# Patient Record
Sex: Female | Born: 1965 | Race: Black or African American | Hispanic: No | Marital: Married | Smoking: Never smoker
Health system: Southern US, Community
[De-identification: ages and names within clinical notes are randomized; demographics above are authoritative.]

## PROBLEM LIST (undated history)

## (undated) DIAGNOSIS — E7800 Pure hypercholesterolemia, unspecified: Secondary | ICD-10-CM

## (undated) DIAGNOSIS — Z992 Dependence on renal dialysis: Secondary | ICD-10-CM

## (undated) DIAGNOSIS — I10 Essential (primary) hypertension: Secondary | ICD-10-CM

## (undated) DIAGNOSIS — I871 Compression of vein: Secondary | ICD-10-CM

## (undated) HISTORY — PX: SKIN GRAFT: SHX250

## (undated) HISTORY — PX: TUBAL LIGATION: SHX77

## (undated) HISTORY — PX: CORNEAL TRANSPLANT: SHX108

## (undated) HISTORY — DX: Dependence on renal dialysis: Z99.2

## (undated) HISTORY — PX: THYROIDECTOMY: SHX17

## (undated) HISTORY — DX: Compression of vein: I87.1

---

## 2002-06-18 DIAGNOSIS — Z992 Dependence on renal dialysis: Secondary | ICD-10-CM

## 2002-06-18 HISTORY — DX: Dependence on renal dialysis: Z99.2

## 2003-06-08 ENCOUNTER — Other Ambulatory Visit: Payer: Self-pay

## 2005-07-27 ENCOUNTER — Emergency Department: Payer: Self-pay | Admitting: Emergency Medicine

## 2005-12-08 ENCOUNTER — Emergency Department: Payer: Self-pay | Admitting: Emergency Medicine

## 2005-12-13 ENCOUNTER — Emergency Department: Payer: Self-pay | Admitting: General Practice

## 2005-12-13 ENCOUNTER — Other Ambulatory Visit: Payer: Self-pay

## 2005-12-18 ENCOUNTER — Ambulatory Visit: Payer: Self-pay | Admitting: Nephrology

## 2005-12-20 ENCOUNTER — Ambulatory Visit: Payer: Self-pay | Admitting: Nephrology

## 2006-04-15 ENCOUNTER — Emergency Department: Payer: Self-pay | Admitting: Emergency Medicine

## 2006-06-21 ENCOUNTER — Emergency Department: Payer: Self-pay | Admitting: Emergency Medicine

## 2006-06-29 ENCOUNTER — Emergency Department: Payer: Self-pay | Admitting: Unknown Physician Specialty

## 2006-09-19 ENCOUNTER — Emergency Department: Payer: Self-pay | Admitting: Internal Medicine

## 2006-10-16 ENCOUNTER — Other Ambulatory Visit: Payer: Self-pay

## 2006-10-16 ENCOUNTER — Emergency Department: Payer: Self-pay | Admitting: Internal Medicine

## 2006-11-05 ENCOUNTER — Ambulatory Visit: Payer: Self-pay | Admitting: Vascular Surgery

## 2006-11-10 ENCOUNTER — Emergency Department: Payer: Self-pay | Admitting: Emergency Medicine

## 2006-12-17 ENCOUNTER — Emergency Department: Payer: Self-pay | Admitting: Emergency Medicine

## 2007-04-21 ENCOUNTER — Emergency Department: Payer: Self-pay

## 2007-04-21 ENCOUNTER — Other Ambulatory Visit: Payer: Self-pay

## 2007-05-28 ENCOUNTER — Ambulatory Visit: Payer: Self-pay

## 2007-09-02 ENCOUNTER — Ambulatory Visit: Payer: Self-pay | Admitting: Nephrology

## 2007-09-16 ENCOUNTER — Emergency Department: Payer: Self-pay | Admitting: Emergency Medicine

## 2007-10-16 ENCOUNTER — Ambulatory Visit: Payer: Self-pay | Admitting: Nephrology

## 2008-06-08 ENCOUNTER — Ambulatory Visit: Payer: Self-pay | Admitting: Internal Medicine

## 2008-11-09 ENCOUNTER — Ambulatory Visit: Payer: Self-pay | Admitting: Internal Medicine

## 2009-08-06 ENCOUNTER — Ambulatory Visit: Payer: Self-pay | Admitting: Nephrology

## 2010-06-15 ENCOUNTER — Ambulatory Visit: Payer: Self-pay

## 2010-08-28 ENCOUNTER — Inpatient Hospital Stay: Payer: Self-pay | Admitting: Internal Medicine

## 2011-03-30 ENCOUNTER — Emergency Department: Payer: Self-pay | Admitting: Unknown Physician Specialty

## 2011-06-18 ENCOUNTER — Ambulatory Visit: Payer: Self-pay | Admitting: Vascular Surgery

## 2011-06-25 ENCOUNTER — Emergency Department: Payer: Self-pay | Admitting: *Deleted

## 2011-06-25 LAB — CBC
HGB: 9.9 g/dL — ABNORMAL LOW (ref 12.0–16.0)
MCH: 32.1 pg (ref 26.0–34.0)
MCHC: 33 g/dL (ref 32.0–36.0)
Platelet: 262 10*3/uL (ref 150–440)
RDW: 16.8 % — ABNORMAL HIGH (ref 11.5–14.5)

## 2011-06-25 LAB — COMPREHENSIVE METABOLIC PANEL
Anion Gap: 12 (ref 7–16)
BUN: 65 mg/dL — ABNORMAL HIGH (ref 7–18)
Chloride: 98 mmol/L (ref 98–107)
Co2: 31 mmol/L (ref 21–32)
Creatinine: 12.38 mg/dL — ABNORMAL HIGH (ref 0.60–1.30)
EGFR (African American): 4 — ABNORMAL LOW
EGFR (Non-African Amer.): 3 — ABNORMAL LOW
Glucose: 82 mg/dL (ref 65–99)
SGPT (ALT): 24 U/L

## 2011-06-25 LAB — CK TOTAL AND CKMB (NOT AT ARMC)
CK, Total: 234 U/L — ABNORMAL HIGH (ref 21–215)
CK-MB: <0.5 ng/mL — ABNORMAL LOW (ref 0.5–3.6)

## 2011-06-25 LAB — PROTIME-INR: Prothrombin Time: 12.8 secs (ref 11.5–14.7)

## 2011-07-03 LAB — POTASSIUM
Potassium: 6.4 mmol/L — ABNORMAL HIGH (ref 3.5–5.1)
Potassium: 5.3 mmol/L — ABNORMAL HIGH (ref 3.5–5.1)

## 2011-07-04 ENCOUNTER — Inpatient Hospital Stay: Payer: Self-pay | Admitting: Internal Medicine

## 2011-07-04 LAB — CBC WITH DIFFERENTIAL/PLATELET
Basophil #: 0 10*3/uL (ref 0.0–0.1)
Basophil %: 0 %
Eosinophil #: 0 10*3/uL (ref 0.0–0.7)
Eosinophil #: 0 10*3/uL (ref 0.0–0.7)
Eosinophil %: 0 %
HGB: 9.9 g/dL — ABNORMAL LOW (ref 12.0–16.0)
Lymphocyte #: 0.4 10*3/uL — ABNORMAL LOW (ref 1.0–3.6)
Lymphocyte %: 8 %
MCH: 32.5 pg (ref 26.0–34.0)
MCH: 32.6 pg (ref 26.0–34.0)
MCHC: 33 g/dL (ref 32.0–36.0)
MCHC: 33.4 g/dL (ref 32.0–36.0)
MCV: 98 fL (ref 80–100)
Monocyte #: 0.1 10*3/uL (ref 0.0–0.7)
Monocyte #: 0.2 10*3/uL (ref 0.0–0.7)
Monocyte %: 1 %
Neutrophil #: 14.5 10*3/uL — ABNORMAL HIGH (ref 1.4–6.5)
Neutrophil %: 91 %
Neutrophil %: 95.7 %
Platelet: 211 10*3/uL (ref 150–440)
Platelet: 206 10*3/uL (ref 150–440)
RBC: 2.81 10*6/uL — ABNORMAL LOW (ref 3.80–5.20)
RDW: 17.5 % — ABNORMAL HIGH (ref 11.5–14.5)
WBC: 15.2 10*3/uL — ABNORMAL HIGH (ref 3.6–11.0)

## 2011-07-04 LAB — BASIC METABOLIC PANEL
Anion Gap: 19 — ABNORMAL HIGH (ref 7–16)
BUN: 109 mg/dL — ABNORMAL HIGH (ref 7–18)
Chloride: 97 mmol/L — ABNORMAL LOW (ref 98–107)
Co2: 24 mmol/L (ref 21–32)
Creatinine: 16.31 mg/dL — ABNORMAL HIGH (ref 0.60–1.30)
EGFR (Non-African Amer.): 3 — ABNORMAL LOW
Glucose: 119 mg/dL — ABNORMAL HIGH (ref 65–99)
Potassium: 5.5 mmol/L — ABNORMAL HIGH (ref 3.5–5.1)
Sodium: 140 mmol/L (ref 136–145)

## 2011-07-05 LAB — CBC WITH DIFFERENTIAL/PLATELET
Basophil #: 0 10*3/uL (ref 0.0–0.1)
Lymphocyte #: 0.8 10*3/uL — ABNORMAL LOW (ref 1.0–3.6)
Lymphocyte %: 8.9 %
MCH: 32.7 pg (ref 26.0–34.0)
MCV: 100 fL (ref 80–100)
Monocyte #: 0.4 10*3/uL (ref 0.0–0.7)
Monocyte %: 4.3 %
Platelet: 198 10*3/uL (ref 150–440)
RDW: 17.9 % — ABNORMAL HIGH (ref 11.5–14.5)
WBC: 9.5 10*3/uL (ref 3.6–11.0)

## 2011-07-05 LAB — RENAL FUNCTION PANEL
Albumin: 3.5 g/dL (ref 3.4–5.0)
Anion Gap: 19 — ABNORMAL HIGH (ref 7–16)
Calcium, Total: 6.7 mg/dL — CL (ref 8.5–10.1)
Chloride: 96 mmol/L — ABNORMAL LOW (ref 98–107)
Co2: 27 mmol/L (ref 21–32)
Creatinine: 9.61 mg/dL — ABNORMAL HIGH (ref 0.60–1.30)
EGFR (Non-African Amer.): 5 — ABNORMAL LOW
Potassium: 4.3 mmol/L (ref 3.5–5.1)

## 2012-07-14 ENCOUNTER — Ambulatory Visit: Payer: Self-pay | Admitting: Vascular Surgery

## 2012-07-14 LAB — POTASSIUM: Potassium: 5.2 mmol/L — ABNORMAL HIGH (ref 3.5–5.1)

## 2012-10-22 ENCOUNTER — Ambulatory Visit: Payer: Self-pay | Admitting: Vascular Surgery

## 2012-11-11 ENCOUNTER — Ambulatory Visit: Payer: Self-pay | Admitting: Vascular Surgery

## 2012-12-05 ENCOUNTER — Inpatient Hospital Stay: Payer: Self-pay | Admitting: Internal Medicine

## 2012-12-05 LAB — BASIC METABOLIC PANEL
Anion Gap: 9 (ref 7–16)
BUN: 39 mg/dL — ABNORMAL HIGH (ref 7–18)
Calcium, Total: 9.1 mg/dL (ref 8.5–10.1)
EGFR (African American): 5 — ABNORMAL LOW
EGFR (Non-African Amer.): 5 — ABNORMAL LOW
Osmolality: 287 (ref 275–301)

## 2012-12-05 LAB — TROPONIN I: Troponin-I: <0.02 ng/mL

## 2012-12-05 LAB — CBC
MCH: 31.9 pg (ref 26.0–34.0)
MCHC: 32.9 g/dL (ref 32.0–36.0)
MCV: 97 fL (ref 80–100)
Platelet: 261 10*3/uL (ref 150–440)
RBC: 3.07 10*6/uL — ABNORMAL LOW (ref 3.80–5.20)
RDW: 15.9 % — ABNORMAL HIGH (ref 11.5–14.5)
WBC: 8 10*3/uL (ref 3.6–11.0)

## 2012-12-05 LAB — PROTIME-INR: INR: 1

## 2012-12-05 LAB — APTT: Activated PTT: <23 secs — ABNORMAL LOW (ref 23.6–35.9)

## 2012-12-05 LAB — PRO B NATRIURETIC PEPTIDE: B-Type Natriuretic Peptide: 2770 pg/mL — ABNORMAL HIGH (ref 0–125)

## 2012-12-06 LAB — CBC WITH DIFFERENTIAL/PLATELET
Basophil #: 0.1 10*3/uL (ref 0.0–0.1)
Basophil %: 0.6 %
Eosinophil #: 0 10*3/uL (ref 0.0–0.7)
Eosinophil %: 0.2 %
HGB: 9.1 g/dL — ABNORMAL LOW (ref 12.0–16.0)
Lymphocyte %: 7.5 %
MCHC: 33.2 g/dL (ref 32.0–36.0)
Monocyte #: 0.1 x10 3/mm — ABNORMAL LOW (ref 0.2–0.9)
Monocyte %: 1.3 %
RBC: 2.84 10*6/uL — ABNORMAL LOW (ref 3.80–5.20)
WBC: 10.7 10*3/uL (ref 3.6–11.0)

## 2012-12-06 LAB — BASIC METABOLIC PANEL
BUN: 49 mg/dL — ABNORMAL HIGH (ref 7–18)
Chloride: 99 mmol/L (ref 98–107)
Co2: 31 mmol/L (ref 21–32)
Creatinine: 10.77 mg/dL — ABNORMAL HIGH (ref 0.60–1.30)
EGFR (Non-African Amer.): 4 — ABNORMAL LOW
Glucose: 166 mg/dL — ABNORMAL HIGH (ref 65–99)
Osmolality: 294 (ref 275–301)
Potassium: 5.3 mmol/L — ABNORMAL HIGH (ref 3.5–5.1)
Sodium: 139 mmol/L (ref 136–145)

## 2012-12-06 LAB — APTT: Activated PTT: 83.9 secs — ABNORMAL HIGH (ref 23.6–35.9)

## 2012-12-06 LAB — LIPID PANEL
Cholesterol: 293 mg/dL — ABNORMAL HIGH (ref 0–200)
HDL Cholesterol: 106 mg/dL — ABNORMAL HIGH (ref 40–60)
Ldl Cholesterol, Calc: 176 mg/dL — ABNORMAL HIGH (ref 0–100)
Triglycerides: 55 mg/dL (ref 0–200)

## 2012-12-07 LAB — BASIC METABOLIC PANEL
Anion Gap: 10 (ref 7–16)
Chloride: 95 mmol/L — ABNORMAL LOW (ref 98–107)
Co2: 31 mmol/L (ref 21–32)
Creatinine: 8.08 mg/dL — ABNORMAL HIGH (ref 0.60–1.30)
Glucose: 132 mg/dL — ABNORMAL HIGH (ref 65–99)
Osmolality: 285 (ref 275–301)
Potassium: 5.3 mmol/L — ABNORMAL HIGH (ref 3.5–5.1)

## 2012-12-07 LAB — CBC WITH DIFFERENTIAL/PLATELET
Basophil #: 0.1 10*3/uL (ref 0.0–0.1)
Eosinophil #: 0 10*3/uL (ref 0.0–0.7)
Eosinophil %: 0 %
HCT: 27.8 % — ABNORMAL LOW (ref 35.0–47.0)
HGB: 9 g/dL — ABNORMAL LOW (ref 12.0–16.0)
MCH: 30.8 pg (ref 26.0–34.0)
Monocyte #: 0.7 x10 3/mm (ref 0.2–0.9)
Monocyte %: 3.6 %
Neutrophil #: 17.9 10*3/uL — ABNORMAL HIGH (ref 1.4–6.5)
Neutrophil %: 90.6 %
RBC: 2.91 10*6/uL — ABNORMAL LOW (ref 3.80–5.20)
RDW: 15.7 % — ABNORMAL HIGH (ref 11.5–14.5)
WBC: 19.7 10*3/uL — ABNORMAL HIGH (ref 3.6–11.0)

## 2012-12-07 LAB — APTT: Activated PTT: 93 secs — ABNORMAL HIGH (ref 23.6–35.9)

## 2012-12-08 LAB — RENAL FUNCTION PANEL
Albumin: 3.7 g/dL (ref 3.4–5.0)
Anion Gap: 15 (ref 7–16)
Chloride: 92 mmol/L — ABNORMAL LOW (ref 98–107)
Co2: 27 mmol/L (ref 21–32)
Creatinine: 11.06 mg/dL — ABNORMAL HIGH (ref 0.60–1.30)
EGFR (African American): 4 — ABNORMAL LOW
EGFR (Non-African Amer.): 4 — ABNORMAL LOW
Phosphorus: 4.6 mg/dL (ref 2.5–4.9)
Sodium: 134 mmol/L — ABNORMAL LOW (ref 136–145)

## 2012-12-08 LAB — PROTIME-INR: INR: 1.1

## 2012-12-08 LAB — CBC WITH DIFFERENTIAL/PLATELET
Basophil #: 0 10*3/uL (ref 0.0–0.1)
Eosinophil %: 0 %
Lymphocyte %: 6.2 %
MCH: 31.8 pg (ref 26.0–34.0)
MCV: 96 fL (ref 80–100)
Neutrophil #: 20.9 10*3/uL — ABNORMAL HIGH (ref 1.4–6.5)
Platelet: 361 10*3/uL (ref 150–440)
RDW: 15.9 % — ABNORMAL HIGH (ref 11.5–14.5)

## 2012-12-09 LAB — APTT
Activated PTT: 153 secs — ABNORMAL HIGH (ref 23.6–35.9)
Activated PTT: >160 secs (ref 23.6–35.9)
Activated PTT: 87.2 secs — ABNORMAL HIGH (ref 23.6–35.9)

## 2012-12-09 LAB — CBC WITH DIFFERENTIAL/PLATELET
Basophil #: 0 10*3/uL (ref 0.0–0.1)
Eosinophil #: 0 10*3/uL (ref 0.0–0.7)
HCT: 27.5 % — ABNORMAL LOW (ref 35.0–47.0)
HGB: 9.2 g/dL — ABNORMAL LOW (ref 12.0–16.0)
Lymphocyte %: 8.2 %
MCH: 31.7 pg (ref 26.0–34.0)
MCHC: 33.4 g/dL (ref 32.0–36.0)
Monocyte #: 1.1 x10 3/mm — ABNORMAL HIGH (ref 0.2–0.9)
Monocyte %: 5.4 %
Platelet: 373 10*3/uL (ref 150–440)
RDW: 16 % — ABNORMAL HIGH (ref 11.5–14.5)
WBC: 20.7 10*3/uL — ABNORMAL HIGH (ref 3.6–11.0)

## 2012-12-09 LAB — PROTIME-INR
INR: 1.3
Prothrombin Time: 16.6 secs — ABNORMAL HIGH (ref 11.5–14.7)

## 2012-12-09 LAB — PHOSPHORUS: Phosphorus: 5.8 mg/dL — ABNORMAL HIGH (ref 2.5–4.9)

## 2012-12-10 LAB — CBC WITH DIFFERENTIAL/PLATELET
Basophil #: 0.1 10*3/uL (ref 0.0–0.1)
Basophil %: 0.4 %
Eosinophil %: 0 %
HCT: 27.8 % — ABNORMAL LOW (ref 35.0–47.0)
HGB: 9.3 g/dL — ABNORMAL LOW (ref 12.0–16.0)
MCH: 32 pg (ref 26.0–34.0)
MCHC: 33.4 g/dL (ref 32.0–36.0)
MCV: 96 fL (ref 80–100)
Monocyte #: 1.5 x10 3/mm — ABNORMAL HIGH (ref 0.2–0.9)
Monocyte %: 7.2 %
Neutrophil #: 17.6 10*3/uL — ABNORMAL HIGH (ref 1.4–6.5)
Platelet: 320 10*3/uL (ref 150–440)
RBC: 2.9 10*6/uL — ABNORMAL LOW (ref 3.80–5.20)
WBC: 20.7 10*3/uL — ABNORMAL HIGH (ref 3.6–11.0)

## 2012-12-10 LAB — PROTIME-INR: Prothrombin Time: 21.7 secs — ABNORMAL HIGH (ref 11.5–14.7)

## 2012-12-10 LAB — APTT: Activated PTT: 103.4 secs — ABNORMAL HIGH (ref 23.6–35.9)

## 2012-12-11 LAB — RENAL FUNCTION PANEL
Albumin: 3.4 g/dL (ref 3.4–5.0)
BUN: 100 mg/dL — ABNORMAL HIGH (ref 7–18)
Calcium, Total: 7.3 mg/dL — ABNORMAL LOW (ref 8.5–10.1)
Co2: 26 mmol/L (ref 21–32)
Creatinine: 11.42 mg/dL — ABNORMAL HIGH (ref 0.60–1.30)
EGFR (African American): 4 — ABNORMAL LOW
EGFR (Non-African Amer.): 4 — ABNORMAL LOW
Osmolality: 290 (ref 275–301)
Phosphorus: 6.4 mg/dL — ABNORMAL HIGH (ref 2.5–4.9)
Potassium: 5.7 mmol/L — ABNORMAL HIGH (ref 3.5–5.1)
Sodium: 128 mmol/L — ABNORMAL LOW (ref 136–145)

## 2012-12-11 LAB — CBC WITH DIFFERENTIAL/PLATELET
Basophil %: 0.2 %
Eosinophil #: 0 10*3/uL (ref 0.0–0.7)
HGB: 9.7 g/dL — ABNORMAL LOW (ref 12.0–16.0)
Lymphocyte #: 1.8 10*3/uL (ref 1.0–3.6)
Lymphocyte %: 9 %
MCH: 32.1 pg (ref 26.0–34.0)
MCHC: 33.7 g/dL (ref 32.0–36.0)
MCV: 95 fL (ref 80–100)
Monocyte #: 1.2 x10 3/mm — ABNORMAL HIGH (ref 0.2–0.9)
RBC: 3.03 10*6/uL — ABNORMAL LOW (ref 3.80–5.20)
RDW: 15.9 % — ABNORMAL HIGH (ref 11.5–14.5)

## 2012-12-11 LAB — POTASSIUM: Potassium: 4.7 mmol/L (ref 3.5–5.1)

## 2012-12-11 LAB — APTT
Activated PTT: >160 secs (ref 23.6–35.9)
Activated PTT: 28.7 secs (ref 23.6–35.9)

## 2012-12-11 LAB — PROTIME-INR
INR: 2.7
Prothrombin Time: 27.9 secs — ABNORMAL HIGH (ref 11.5–14.7)

## 2012-12-12 LAB — CBC WITH DIFFERENTIAL/PLATELET
Basophil #: 0 10*3/uL (ref 0.0–0.1)
Eosinophil #: 0 10*3/uL (ref 0.0–0.7)
Eosinophil %: 0.1 %
Lymphocyte #: 0.3 10*3/uL — ABNORMAL LOW (ref 1.0–3.6)
MCH: 32 pg (ref 26.0–34.0)
MCHC: 33.3 g/dL (ref 32.0–36.0)
Monocyte #: 0.1 x10 3/mm — ABNORMAL LOW (ref 0.2–0.9)
Platelet: 219 10*3/uL (ref 150–440)
RBC: 2.87 10*6/uL — ABNORMAL LOW (ref 3.80–5.20)
RDW: 16.1 % — ABNORMAL HIGH (ref 11.5–14.5)
WBC: 14.9 10*3/uL — ABNORMAL HIGH (ref 3.6–11.0)

## 2012-12-12 LAB — BASIC METABOLIC PANEL
Calcium, Total: 7.4 mg/dL — ABNORMAL LOW (ref 8.5–10.1)
Chloride: 94 mmol/L — ABNORMAL LOW (ref 98–107)
Co2: 31 mmol/L (ref 21–32)
Creatinine: 8.54 mg/dL — ABNORMAL HIGH (ref 0.60–1.30)
EGFR (African American): 6 — ABNORMAL LOW
EGFR (Non-African Amer.): 5 — ABNORMAL LOW
Osmolality: 289 (ref 275–301)
Potassium: 3.9 mmol/L (ref 3.5–5.1)
Sodium: 135 mmol/L — ABNORMAL LOW (ref 136–145)

## 2012-12-13 LAB — BASIC METABOLIC PANEL
BUN: 90 mg/dL — ABNORMAL HIGH (ref 7–18)
Calcium, Total: 6.8 mg/dL — CL (ref 8.5–10.1)
Co2: 28 mmol/L (ref 21–32)
Creatinine: 11.92 mg/dL — ABNORMAL HIGH (ref 0.60–1.30)
EGFR (African American): 4 — ABNORMAL LOW
Glucose: 131 mg/dL — ABNORMAL HIGH (ref 65–99)
Osmolality: 296 (ref 275–301)
Potassium: 4.1 mmol/L (ref 3.5–5.1)
Sodium: 133 mmol/L — ABNORMAL LOW (ref 136–145)

## 2012-12-13 LAB — CBC WITH DIFFERENTIAL/PLATELET
Basophil #: 0 10*3/uL (ref 0.0–0.1)
Basophil %: 0.1 %
HCT: 28 % — ABNORMAL LOW (ref 35.0–47.0)
Lymphocyte %: 6.2 %
MCHC: 33.2 g/dL (ref 32.0–36.0)
MCV: 96 fL (ref 80–100)
Monocyte #: 1.2 x10 3/mm — ABNORMAL HIGH (ref 0.2–0.9)
Neutrophil #: 16.1 10*3/uL — ABNORMAL HIGH (ref 1.4–6.5)
Neutrophil %: 86.7 %
RBC: 2.91 10*6/uL — ABNORMAL LOW (ref 3.80–5.20)
RDW: 16.8 % — ABNORMAL HIGH (ref 11.5–14.5)
WBC: 18.5 10*3/uL — ABNORMAL HIGH (ref 3.6–11.0)

## 2012-12-13 LAB — ALBUMIN: Albumin: 3.2 g/dL — ABNORMAL LOW (ref 3.4–5.0)

## 2012-12-13 LAB — PHOSPHORUS: Phosphorus: 4.8 mg/dL (ref 2.5–4.9)

## 2012-12-18 LAB — CULTURE, BLOOD (SINGLE)

## 2012-12-31 ENCOUNTER — Ambulatory Visit: Payer: Self-pay | Admitting: Internal Medicine

## 2013-01-08 ENCOUNTER — Other Ambulatory Visit: Payer: Self-pay | Admitting: Nephrology

## 2013-01-08 LAB — PROTIME-INR
INR: 2.2
Prothrombin Time: 23.6 secs — ABNORMAL HIGH (ref 11.5–14.7)

## 2013-01-13 ENCOUNTER — Encounter: Payer: Self-pay | Admitting: General Surgery

## 2013-01-13 ENCOUNTER — Ambulatory Visit (INDEPENDENT_AMBULATORY_CARE_PROVIDER_SITE_OTHER): Payer: Medicare Other | Admitting: General Surgery

## 2013-01-13 ENCOUNTER — Other Ambulatory Visit: Payer: Self-pay

## 2013-01-13 VITALS — BP 160/84 | HR 84 | Resp 13 | Ht 60.0 in | Wt 187.0 lb

## 2013-01-13 DIAGNOSIS — N63 Unspecified lump in unspecified breast: Secondary | ICD-10-CM

## 2013-01-13 DIAGNOSIS — N6310 Unspecified lump in the right breast, unspecified quadrant: Secondary | ICD-10-CM

## 2013-01-13 DIAGNOSIS — I871 Compression of vein: Secondary | ICD-10-CM | POA: Insufficient documentation

## 2013-01-13 DIAGNOSIS — Z803 Family history of malignant neoplasm of breast: Secondary | ICD-10-CM | POA: Insufficient documentation

## 2013-01-13 NOTE — Patient Instructions (Addendum)
Patient to return in 3 months. To call if new problems with her breasts

## 2013-01-13 NOTE — Progress Notes (Signed)
Patient ID: Angela Best, female   DOB: 1966/06/11, 47 y.o.   MRN: [id number]  Chief Complaint  Patient presents with  . Other    mammogram    HPI Angela Best is a 47 y.o. female who presents for a breast evaluation. The most recent mammogram was done on 12/31/12 cat4 Patient does perform regular self breast checks and does not regular mammograms done. Patient has never had breast problems in the past.  Pt has ESRD and is on hemodialysis. 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 She is on coumadin now. Since that time she has noted both breasts being swollen and more uncomfortable.  HPI  Past Medical History  Diagnosis Date  . Dialysis patient 2004  . SVC (superior vena cava obstruction)     Past Surgical History  Procedure Laterality Date  . Cesarean section    . Tubal ligation    . Thyroidectomy    . Skin graft Left   . Corneal transplant      Family History  Problem Relation Age of Onset  . Breast cancer Mother     Social History History  Substance Use Topics  . Smoking status: Never Smoker   . Smokeless tobacco: Never Used  . Alcohol Use: No    Allergies  Allergen Reactions  . Benadryl (Diphenhydramine Hcl) Itching  . Captopril Itching  . Contrast Media (Iodinated Diagnostic Agents) Itching  . Demerol (Meperidine) Itching  . Morphine And Related Itching  . Tape Rash    Current Outpatient Prescriptions  Medication Sig Dispense Refill  . aspirin 81 MG tablet Take 81 mg by mouth daily.      . calcium & magnesium carbonates (MYLANTA) 311-232 MG per tablet Take 1 tablet by mouth daily.      Marland Kitchen warfarin (COUMADIN) 1 MG tablet Take 1 tablet by mouth 4 (four) times daily.       No current facility-administered medications for this visit.    Review of Systems Review of Systems  Constitutional: Negative.   Respiratory: Negative.   Cardiovascular: Negative.     Blood pressure 160/84,  pulse 84, resp. rate 13, height 5' (1.524 m), weight 187 lb (84.823 kg).  Physical Exam Physical Exam  Constitutional: She is oriented to person, place, and time. She appears well-developed and well-nourished.  HENT:  Head:    Eyes: Conjunctivae are normal. No scleral icterus.  Neck: No mass and no thyromegaly present.  Cardiovascular: Normal rate, regular rhythm and normal heart sounds.   Pulmonary/Chest: Effort normal and breath sounds normal. Right breast exhibits skin change and tenderness. Right breast exhibits no inverted nipple, no mass and no nipple discharge. Left breast exhibits skin change and tenderness. Left breast exhibits no inverted nipple, no mass and no nipple discharge.    Abdominal: Soft. Bowel sounds are normal. There is no tenderness.  Lymphadenopathy:    She has no cervical adenopathy.    She has no axillary adenopathy.  Neurological: She is alert and oriented to person, place, and time.  Skin: Skin is warm and dry.       Data Reviewed Mammogram reviewed bilateral swelling and venous distension. No focal abnormality. Likely all due to SVC syndrome. Targeted US over palpable thickening in right breast 1-2 o'cl shows no abnormality Assessment    Right breast mass is likely benign-can be followed. Mammographic findings consistent with SVC syndrome     Plan  Advised use of a snug bra. F/U in  3 mos.        Stephens Shreve G 01/13/2013, 6:41 PM

## 2013-04-13 ENCOUNTER — Ambulatory Visit: Payer: Medicare Other | Admitting: General Surgery

## 2013-04-23 ENCOUNTER — Ambulatory Visit: Payer: Self-pay | Admitting: General Surgery

## 2013-06-04 ENCOUNTER — Encounter: Payer: Self-pay | Admitting: *Deleted

## 2013-10-13 ENCOUNTER — Inpatient Hospital Stay: Payer: Self-pay | Admitting: Internal Medicine

## 2013-10-13 LAB — CBC
HCT: 35.8 % (ref 35.0–47.0)
HGB: 11.5 g/dL — AB (ref 12.0–16.0)
MCH: 32.1 pg (ref 26.0–34.0)
MCHC: 32.2 g/dL (ref 32.0–36.0)
MCV: 100 fL (ref 80–100)
PLATELETS: 247 10*3/uL (ref 150–440)
RBC: 3.6 10*6/uL — AB (ref 3.80–5.20)
RDW: 15.4 % — ABNORMAL HIGH (ref 11.5–14.5)
WBC: 4.7 10*3/uL (ref 3.6–11.0)

## 2013-10-13 LAB — COMPREHENSIVE METABOLIC PANEL
ANION GAP: 9 (ref 7–16)
Albumin: 4.5 g/dL (ref 3.4–5.0)
Alkaline Phosphatase: 126 U/L — ABNORMAL HIGH
BUN: 33 mg/dL — ABNORMAL HIGH (ref 7–18)
Bilirubin,Total: 0.3 mg/dL (ref 0.2–1.0)
CHLORIDE: 96 mmol/L — AB (ref 98–107)
Calcium, Total: 9 mg/dL (ref 8.5–10.1)
Co2: 34 mmol/L — ABNORMAL HIGH (ref 21–32)
Creatinine: 8.21 mg/dL — ABNORMAL HIGH (ref 0.60–1.30)
EGFR (African American): 6 — ABNORMAL LOW
EGFR (Non-African Amer.): 5 — ABNORMAL LOW
Glucose: 79 mg/dL (ref 65–99)
OSMOLALITY: 284 (ref 275–301)
Potassium: 3.6 mmol/L (ref 3.5–5.1)
SGOT(AST): 27 U/L (ref 15–37)
SGPT (ALT): 28 U/L (ref 12–78)
Sodium: 139 mmol/L (ref 136–145)
Total Protein: 9 g/dL — ABNORMAL HIGH (ref 6.4–8.2)

## 2013-10-13 LAB — PROTIME-INR
INR: 3.7
Prothrombin Time: 35.5 secs — ABNORMAL HIGH (ref 11.5–14.7)

## 2013-10-13 LAB — APTT: Activated PTT: 65.9 secs — ABNORMAL HIGH (ref 23.6–35.9)

## 2013-10-14 LAB — CBC WITH DIFFERENTIAL/PLATELET
BASOS PCT: 1.1 %
Basophil #: 0.1 10*3/uL (ref 0.0–0.1)
Eosinophil #: 0.1 10*3/uL (ref 0.0–0.7)
Eosinophil %: 1.7 %
HCT: 32.2 % — ABNORMAL LOW (ref 35.0–47.0)
HGB: 10.4 g/dL — ABNORMAL LOW (ref 12.0–16.0)
LYMPHS ABS: 1.5 10*3/uL (ref 1.0–3.6)
LYMPHS PCT: 30.3 %
MCH: 32.3 pg (ref 26.0–34.0)
MCHC: 32.4 g/dL (ref 32.0–36.0)
MCV: 100 fL (ref 80–100)
Monocyte #: 0.6 x10 3/mm (ref 0.2–0.9)
Monocyte %: 12.1 %
Neutrophil #: 2.7 10*3/uL (ref 1.4–6.5)
Neutrophil %: 54.8 %
PLATELETS: 221 10*3/uL (ref 150–440)
RBC: 3.22 10*6/uL — AB (ref 3.80–5.20)
RDW: 15.3 % — AB (ref 11.5–14.5)
WBC: 5 10*3/uL (ref 3.6–11.0)

## 2013-10-14 LAB — PROTIME-INR
INR: 1.5
Prothrombin Time: 17.4 secs — ABNORMAL HIGH (ref 11.5–14.7)

## 2014-04-19 ENCOUNTER — Encounter: Payer: Self-pay | Admitting: General Surgery

## 2014-06-03 IMAGING — CR DG CHEST 2V
1 series · 2 of 2 positions shown · non-contrast
Comparison: none

REASON FOR EXAM: SOB
COMMENTS:

PROCEDURE:     DXR - DXR CHEST PA (OR AP) AND LATERAL  - December 05, 2012  [DATE]
RESULT:     The lungs are clear. The cardiac silhouette and visualized bony
skeleton are unremarkable. Patient's has taken a shallow inspiration.

[Series 1: pa · 0.17mm/px · 2 of 2 slices shown]
[im 1/2]
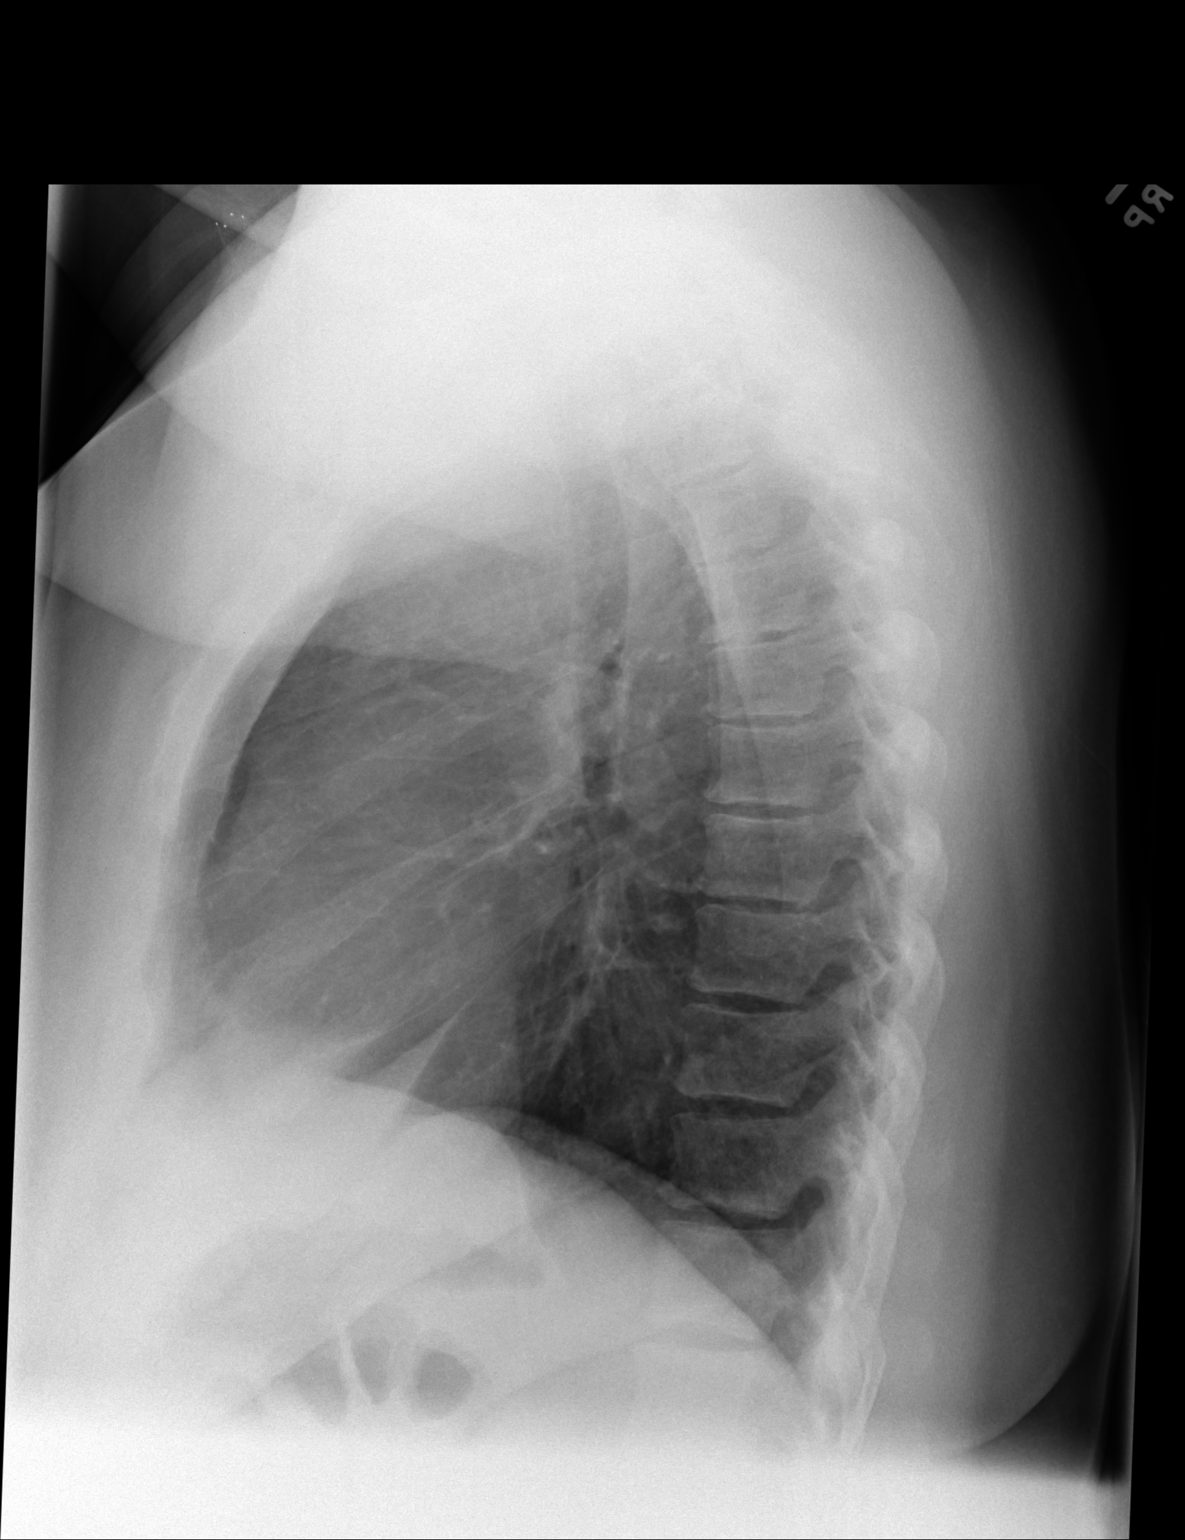
[im 2/2]
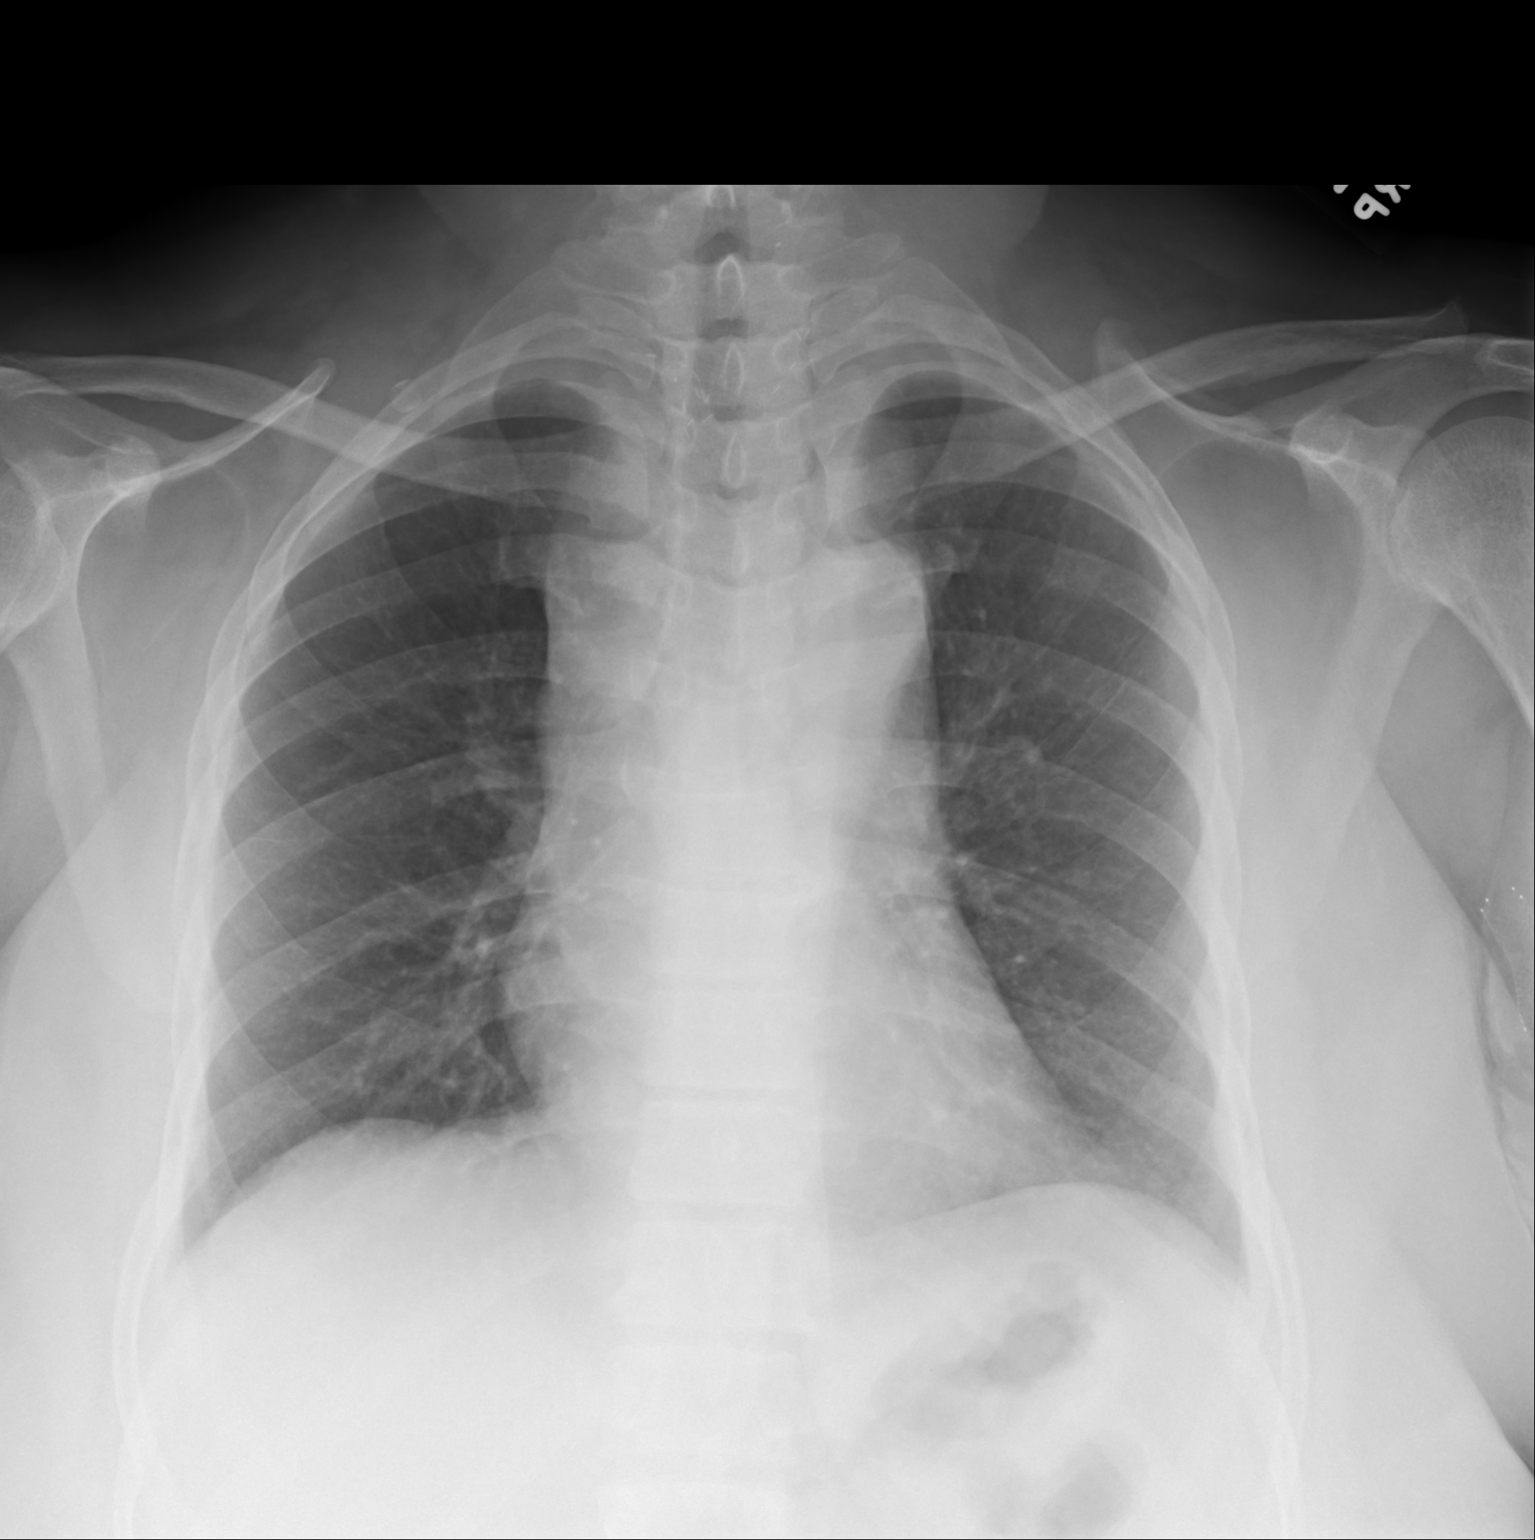

[2 of 2 positions shown; findings below may reference images not displayed]

IMPRESSION: 1. Chest radiograph without evidence of acute cardiopulmonary disease.

## 2014-06-04 IMAGING — CT CT CHEST W/ CM
1 of 2 series · 13 of 31 positions shown, 17 images · non-contrast
Comparison: none

REASON FOR EXAM: swelling upper chest and face- likely subclavian dvt
COMMENTS:

PROCEDURE:     CT  - CT CHEST (FOR PE) W  - December 06, 2012 [DATE]
RESULT:     Chest CT dated 12/06/2012
TECHNIQUE: Helical 3 mm sections were obtained the thoracic inlet through
the lung bases status post intravenous administration of 100 mL of
[id number].

[Series 7: lung windows · axial · 0.74mm/px · z∈[+469,+700]mm · 13 of 92 slices shown, 17 images]
[im 8/92  mediastinal]
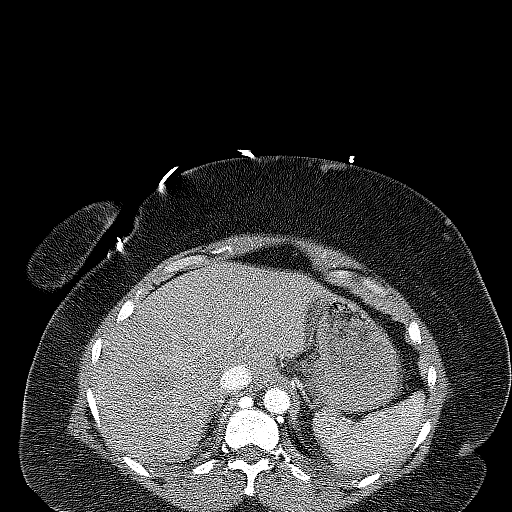
[im 8/92  lung]
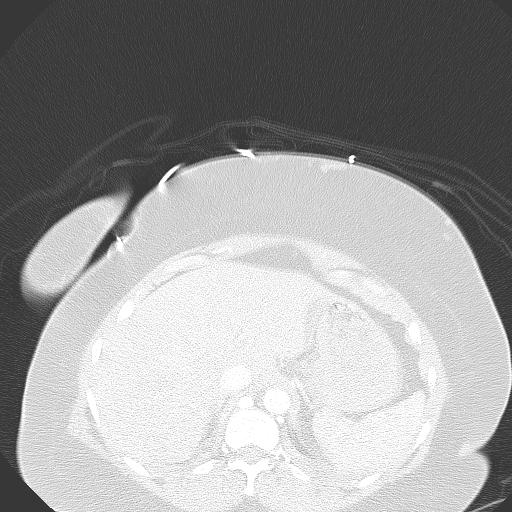
[im 15/92  lung]
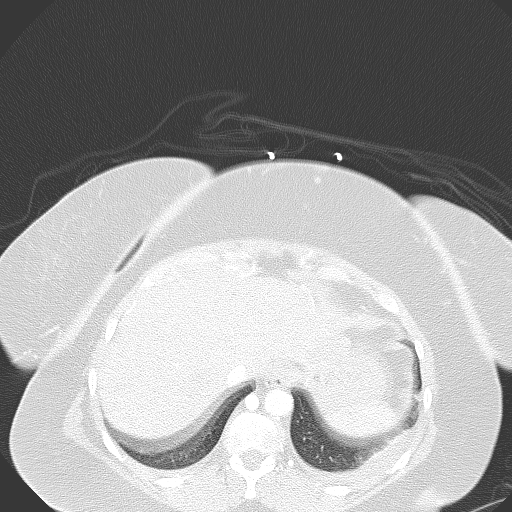
[im 22/92  lung]
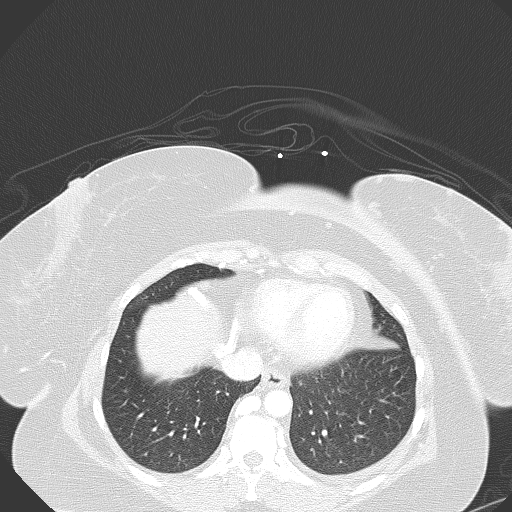
[im 29/92  lung]
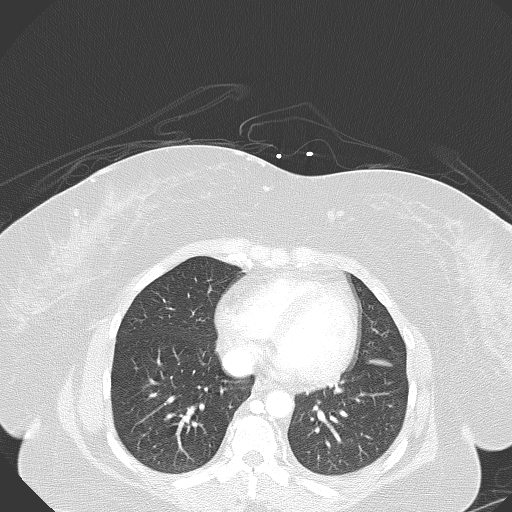
[im 36/92  mediastinal]
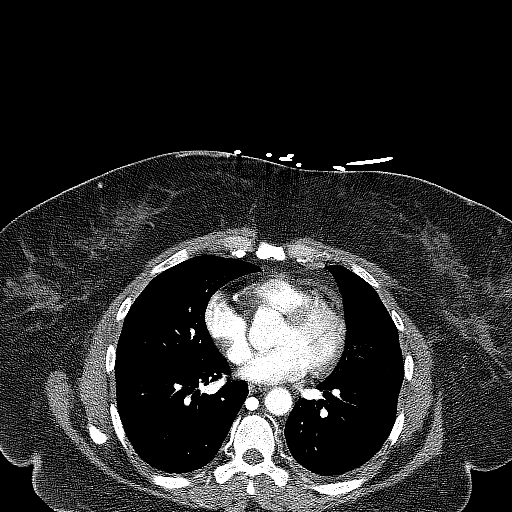
[im 36/92  lung]
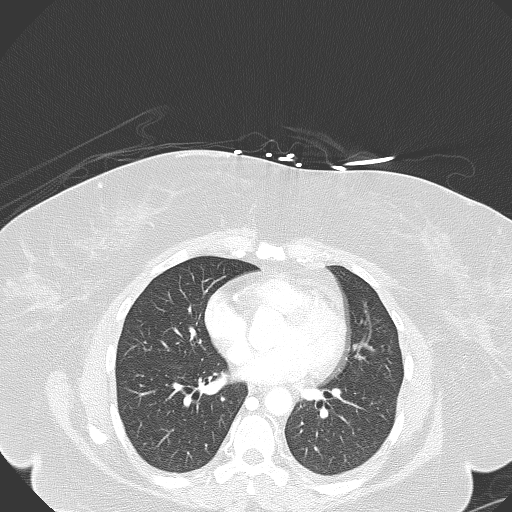
[im 43/92  lung]
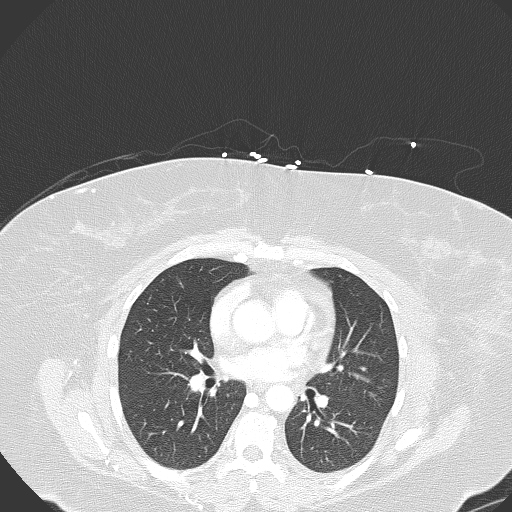
[im 46/92  lung]
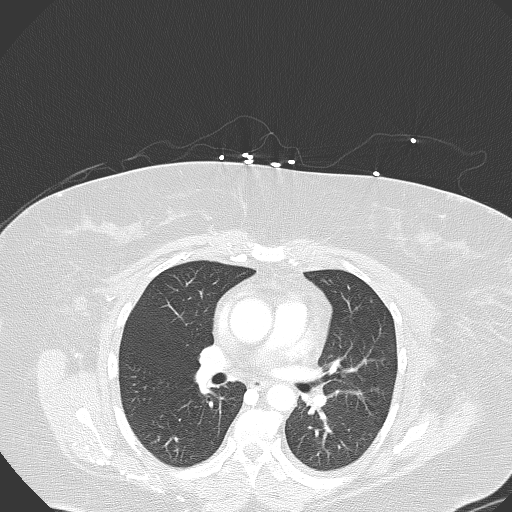
[im 50/92  lung]
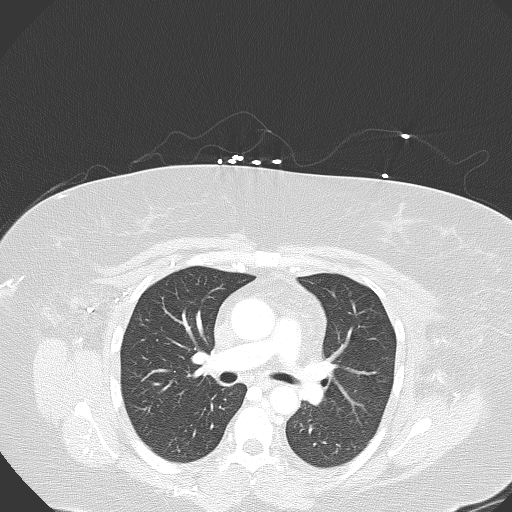
[im 57/92  mediastinal]
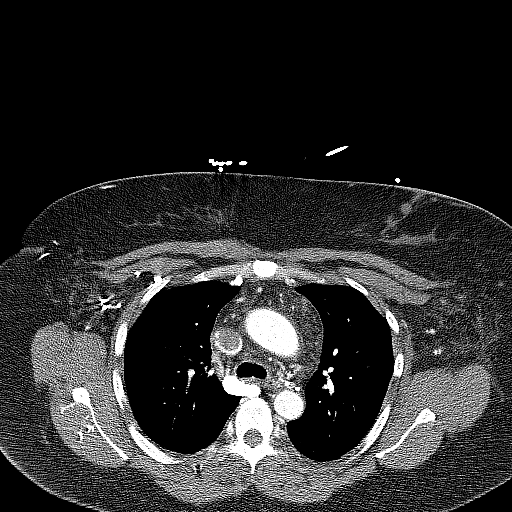
[im 57/92  lung]
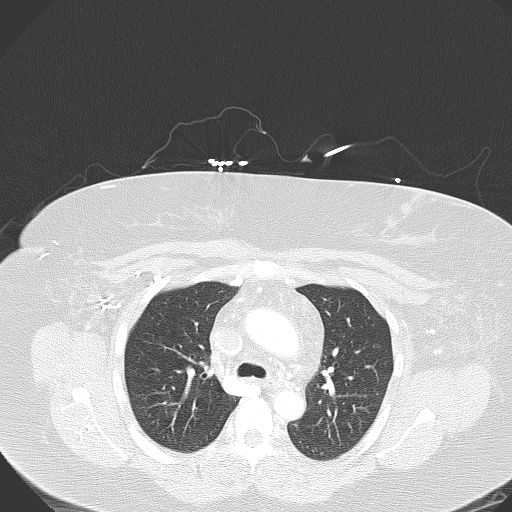
[im 64/92  lung]
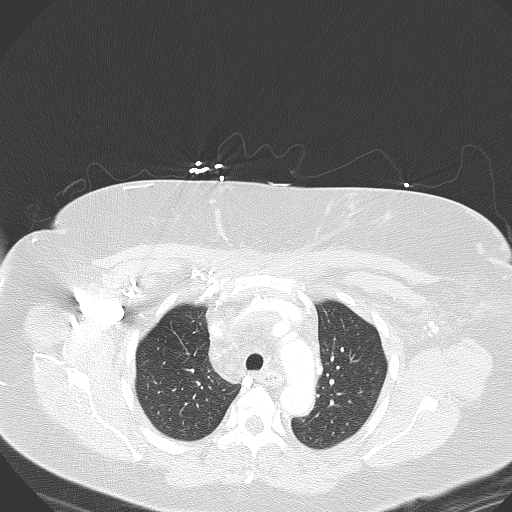
[im 71/92  lung]
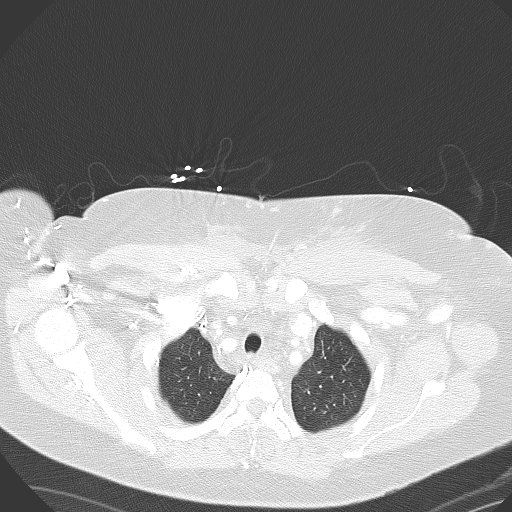
[im 78/92  lung]
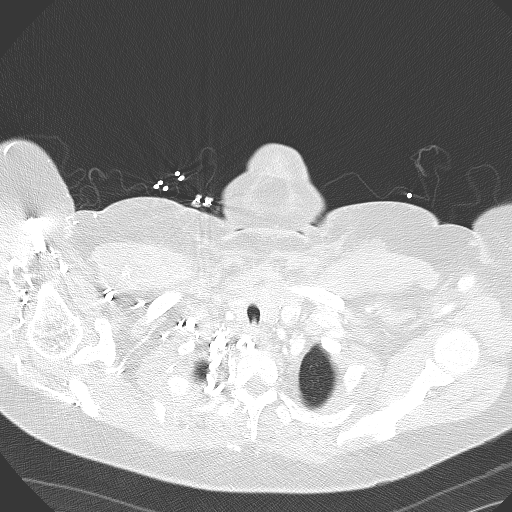
[im 85/92  mediastinal]
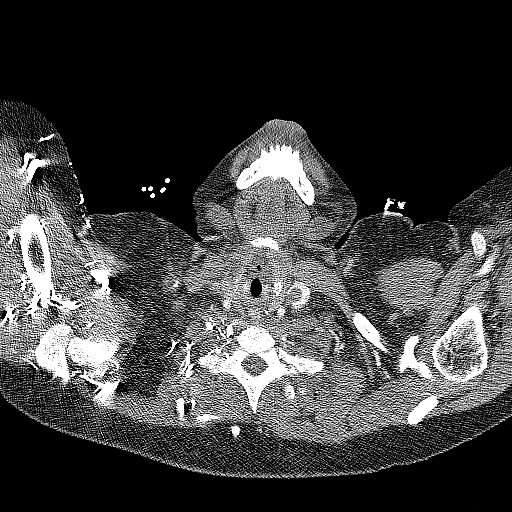
[im 85/92  lung]
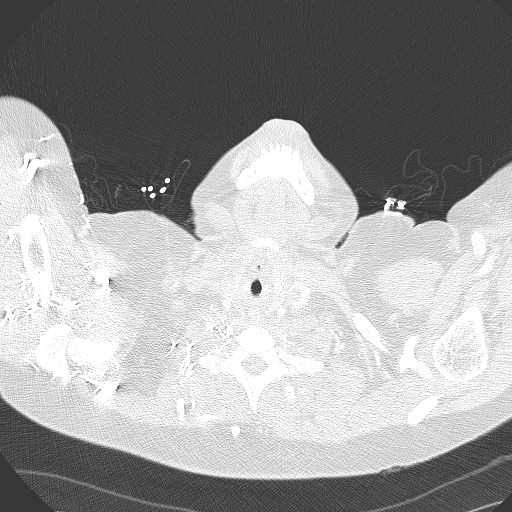

[13 of 31 positions shown; findings below may reference images not displayed]

FINDINGS: Multiple venous collaterals identified along the chest wall on the
right anteriorly and posteriorly. Collaterals also identified extending out
into the proximal portion of the right arm. The patient is status post right
arm injection. There is opacification appreciated within the subclavian
vein. The opacification diminishes proximally within the vein prior to
superior vena caval insertion. A partial filling defect is identified within
the superior vena cava as well as extending into the innominate vein on the
left. The filling defect extends into the internal jugular vein on the left.
Mid subclavian vein appears opacified. There is no evidence of opacification
within the internal jugular vein on the right.

The mediastinum hilar regions and structures demonstrate no evidence of
mediastinal or hilar adenopathy. Anomalous drainage of a pulmonary vein is
identified within the anterior base of the right lower lobe.

There is no evidence of filling defects within the main, lobar, or segmental
pulmonary arteries.

The lung parenchyma demonstrates trace bilateral effusions left greater than
right. The visualized upper abdominal viscera demonstrate no gross
abnormalities. There is no evidence of a thoracic aortic aneurysm.

The visualized upper abdominal viscera demonstrate no gross abnormalities.
IMPRESSION: 1. No CT evidence of pulmonary arterial embolic disease.
2. Findings concerning for thrombus within the innominate vein on the left,
the internal jugular vein on the left as well as the superior vena cava. The
internal jugular of the right his are opacified and there findings
concerning for obstruction considering the extensive venous collaterals in
the chest wall. The subclavian on the right appears opacified to the level
of the vena cava.
3. Alternatively the findings within the internal jugular vein on the right
may be due to lack of contrast opacification.
4. Correlation with patient's clinical status is recommended and if
clinically warranted vascular surgical interventional consultation
recommended.

## 2014-06-10 IMAGING — CR DG CHEST 1V PORT
1 series · 1 of 1 positions shown · non-contrast
Comparison: none

REASON FOR EXAM: SOB, tachy
COMMENTS:

[ap]
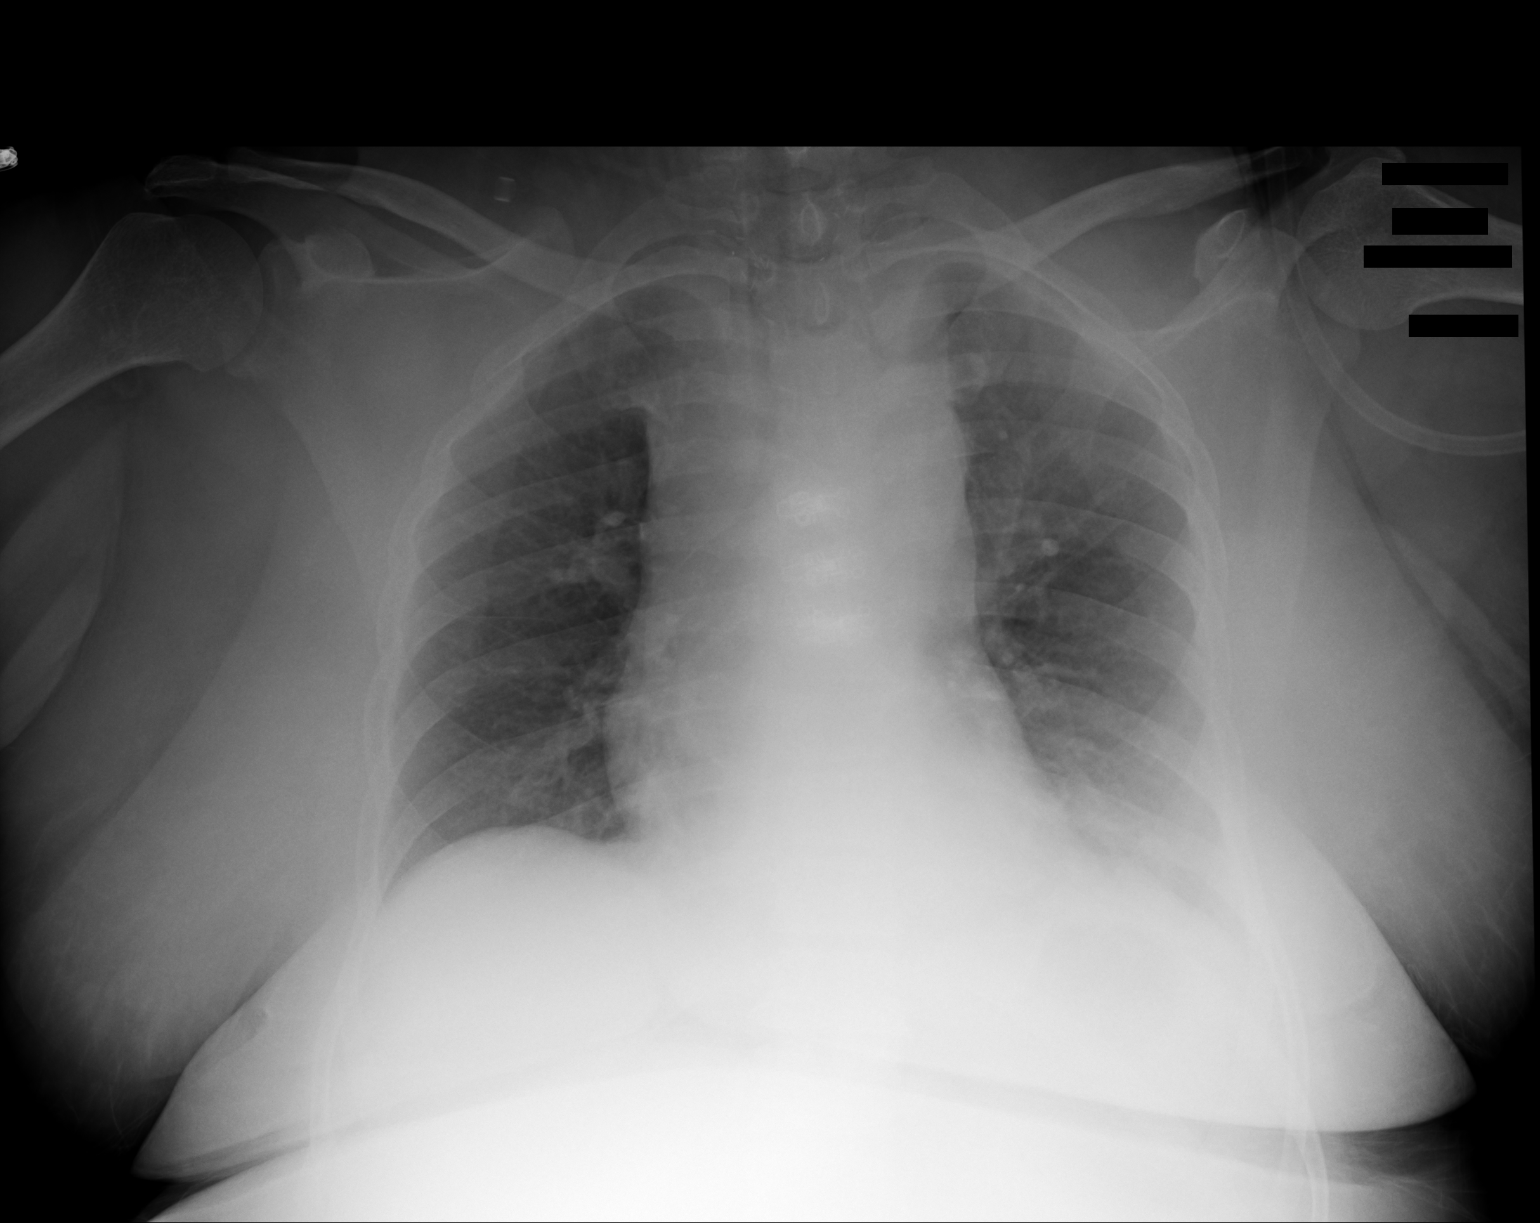

[1 of 1 positions shown; findings below may reference images not displayed]

PROCEDURE:     DXR - DXR PORTABLE CHEST SINGLE VIEW  - December 12, 2012 [DATE]

RESULT:     Comparison is made to the study of 12/05/2012.

There is persistent widening of the mediastinum. Heart is nonenlarged. There
is some left lung base atelectasis suspected. Followup PA and lateral views
are recommended.
IMPRESSION: Please see above.

[REDACTED]

## 2014-10-08 NOTE — H&P (Signed)
PATIENT NAME:  Angela Best, Angela Best MR#:  [id number] DATE OF BIRTH:  May 09, 1966  DATE OF ADMISSION:  12/05/2012  PRIMARY CARE PHYSICIAN: Probably Dr. Smith Mince at [facility].   CHIEF COMPLAINT: Swelling from the chest up.   HISTORY OF PRESENT ILLNESS: This is a 49 year old female dialysis patient who last week had a declot in the left arm AV graft in order to get dialysis. They did have a left chest catheter that was placed and then removed. She did have some swelling afterwards, but after dialysis yesterday the swelling went down, but swelling really worsened overnight. From her chest upwards, she feels very tight and swollen and her eyelids are swollen. She came into the ER for further evaluation. The ER physician spoke with Dr. Lucky Cowboy who recommended a CAT scan and heparin drip. THE PATIENT IS ALLERGIC TO CAT SCAN DYE AND NEEDS TO BE PREMEDICATED, SO THE PATIENT NEEDS TO BE ADMITTED FOR FURTHER EVALUATION.   PAST MEDICAL HISTORY: End-stage renal disease on hemodialysis, hypertension not on any medications, insomnia and anxiety with dialysis.   PAST SURGICAL HISTORY: AV graft left arm and declot procedure. She has had 2 catheters placed and removed in the chest. She has had a C-section, parathyroidectomy with placement of one of the glands in the left arm. She has had a cornea transplant and 2 tubal ligations.   ALLERGIES: BENADRYL IV, CHLORHEXIDINE, CALCITRIOL, CONTRAST DYE, CEFAZOLIN, DEMEROL AND MORPHINE.   MEDICATIONS: Include Ambien 10 mg at bedtime for sleep, aspirin 81 mg 2 tablets daily, calcium acetate 667 mg 3 tablets 3 times a day with meals and 2 tablets with snacks, lorazepam 0.5 mg on dialysis days.   SOCIAL HISTORY: No smoking. Rare alcohol. No drug use. Works as a Freight forwarder.   FAMILY HISTORY: Father had a CVA, diabetes, hypertension. Mother with lymphoma. Brother with CVA, diabetes, hypertension. Brother is a dialysis patient with diabetes and hypertension.   REVIEW OF SYSTEMS:     CONSTITUTIONAL: Positive for sweats, menopausal. No fever or chills. Positive for fatigue. No weight gain. No weight loss.  EYES: She does wear glasses and felt that her vision was a little bit cloudy with the swelling.  EARS, NOSE, MOUTH AND THROAT: No hearing loss. Positive for sore throat. No difficulty swallowing.  CARDIOVASCULAR: She does feel tight in the upper chest with the swelling.  RESPIRATORY: No shortness of breath. No sputum. No hemoptysis.  GASTROINTESTINAL: No nausea. No vomiting. No abdominal pain. No diarrhea. No constipation. Does have hemorrhoids and does see bright red blood per rectum occasionally.  GENITOURINARY: Urinates very little. No blood in the urine. No burning.  MUSCULOSKELETAL: Some discomfort in the right leg.  INTEGUMENTARY: No rashes or eruptions.  NEUROLOGIC: No fainting or blackouts.  PSYCHIATRIC: No anxiety or depression.  ENDOCRINE: No thyroid problems.  HEMATOLOGIC AND LYMPHATIC: History of anemia.   PHYSICAL EXAMINATION: VITAL SIGNS: Temperature 98.1, pulse 114, respirations 20, blood pressure 127/73, pulse oximetry 100% on room air.  GENERAL: No respiratory distress.  EYES: Conjunctivae and lids normal. Pupils equal, round and reactive to light. Extraocular muscles intact. No nystagmus.  EARS, NOSE, MOUTH AND THROAT: Tympanic membranes: No erythema. Nasal mucosa: No erythema. Throat: No erythema. No exudate seen. Lips and gums: No lesions.  NECK: No JVD. No bruits. No lymphadenopathy. No thyromegaly. No thyroid nodules palpated.  LUNGS: Clear to auscultation. No use of accessory muscles to breathe. No rhonchi, rales or wheeze heard.  CARDIOVASCULAR: S1, S2 normal, tachycardic. No gallops, rubs or murmurs  heard. Carotid upstroke 2+ bilaterally. No bruits. Dorsalis pedis pulses 2+ bilaterally. No edema of the lower extremities. Some swelling of the upper extremities, chest and face and neck.  GASTROINTESTINAL: Soft, nontender. No  organomegaly/splenomegaly. Normoactive bowel sounds. No masses felt.  LYMPHATIC: No lymph nodes in the neck.  MUSCULOSKELETAL: No lower extremity edema. No clubbing. No cyanosis. Upper extremity edema and swelling from the chest upwards.  SKIN: No ulcers or lesions seen.  NEUROLOGIC: Cranial nerves II through XII grossly intact. Deep tendon reflexes 1+ bilateral lower extremities.  PSYCHIATRIC: The patient is oriented to person, place and time.   LABORATORY AND DIAGNOSTIC DATA: EKG shows sinus tachycardia, PVCs, low voltage, Q waves inferiorly. White blood cell count 8.0, hemoglobin and hematocrit 9.8 and 29.8, platelet count 261. Glucose 102, BUN 39, creatinine 9.27, sodium 139, potassium 4.2, chloride 100, CO2 of 30, calcium 9.1. Troponin negative. BNP 2770. INR 1.0.   ASSESSMENT AND PLAN: 1.  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 We will start a heparin drip. SINCE THE PATIENT IS ALLERGIC TO CT SCAN DYE, I WILL HAVE TO PREMEDICATE HER BEFORE A CT SCAN OF THE CHEST THAT CAN BE DONE. Likely will end up needing Coumadin.  2.  End-stage renal disease, on hemodialysis: Next is Saturday. Will likely get a CT scan of the chest in the morning and dialysis afterwards.  3.  Insomnia: On Ambien.  4.  Anxiety with dialysis: Ativan prior to dialysis.  5.  Low voltage on EKG in a dialysis patient: Will need to rule out a pericardial effusion. Will get an echocardiogram.  6.  Will also get nephrology and vascular surgery consultations in the morning.   TIME SPENT ON ADMISSION: 50 minutes.    ____________________________ Tana Conch. Leslye Peer, MD rjw:jm D: 12/05/2012 20:43:31 ET T: 12/05/2012 20:59:49 ET JOB#: [id number]  cc: Tana Conch. Leslye Peer, MD, <Dictator> Marisue Brooklyn MD ELECTRONICALLY SIGNED 12/27/2012 11:12

## 2014-10-08 NOTE — Op Note (Signed)
PATIENT NAME:  Angela Best, PRIZZI MR#:  [id number] DATE OF BIRTH:  04/25/1966  DATE OF PROCEDURE: 11/11/2012  PREOPERATIVE DIAGNOSIS:  1.  Thrombosed right arm brachial axillary dialysis graft.  2.  End-stage renal disease, requiring hemodialysis.  3.  Central venous stenosis.   POSTOPERATIVE DIAGNOSIS: 1.  Thrombosed right arm brachial axillary dialysis graft.  2.  End-stage renal disease, requiring hemodialysis.  3.  Central venous stenosis.   PROCEDURES PERFORMED: 1.  Contrast injection, left arm brachial axillary dialysis graft.  2. Mechanical thrombectomy, left arm brachial axillary dialysis graft with Trerotola device; unsuccessful.  3.  Percutaneous transluminal angioplasty, venous outflow to 8 mm.  4.  Placement of left internal jugular tunneled catheter.   SURGEON: Civil engineer, contracting, M.D.   SEDATION: Precedex drip.   ACCESS:  1.  6-French sheath, left arm arteriovenous graft; antegrade.  2.  6-French sheath, left arm arteriovenous graft; retrograde.  CONTRAST USED: Isovue 35 mL.   FLUORO TIME: 8.7 minutes.   INDICATIONS: Angela Best is a 49 year old woman who presented to dialysis with thrombosis of her graft. She has had multiple interventions in the past.   Risks and benefits for attempts at declot as well as placement of Permacath were reviewed. All have agreed to proceed.   PROCEDURE: The patient was taken to special procedures, placed in the supine position. After adequate sedation was achieved she was positioned with her left arm extended, palm-upward, and otherwise supine. The left arm was then prepped and draped in sterile fashion. One-percent  lidocaine was infiltrated in the soft tissues and access to the AV graft was obtained with a micropuncture needle, MicroWire, followed by a MicroSheath, J wire, followed by a 6-French sheath. Hand injection of contrast then demonstrated thrombus within the graft. Catheter and wire were then negotiated, first into the axillary then the  subclavian for formal central views. Thrombus was noted extending in the axillary, almost up to the subclavian. Previously-placed stents are noted. Four-thousand units of heparin were given. A Trerotola device was opened onto the field and multiple passes were made. There appeared to be an impingement at the venous outflow which has previously been stented, and an 8 x 6 balloon was advanced over the wire and used to angioplasty this area. There was minimal improvement. Several more passes with the Trerotola were made, and there appeared to be reasonable clearance of the thrombus in the vein, although the axillary vein still appeared to be problematic. Therefore, a second sheath was inserted as described above in a retrograde direction, and the Trerotola device was advanced into the brachial artery. It was then opened, but not engaged, and pulled back into the graft. Multiple passes in this fashion were. There was still no flow. The KMP catheter and wire were negotiated into the brachial artery and there appeared to be impingement of the previously-placed stent within the artery and thrombus within the artery there. There were extensive collaterals reconstituting the trifurcation in the forearm.   Given the appearance of the graft as well as the axillary vein, it would take on the order of 4 to  5 stents to rectify both arterial and venous problems, and this would have a poor longevity, therefore I have elected to place a catheter and will investigate the right arm for potential future access.   The left neck was then prepped and draped in a sterile fashion. Ultrasound was placed in a sterile sleeve. Jugular vein was identified. It was echolucent and compressible, indicating patency. Image was recorded  for the permanent record. A micropuncture needle was inserted. MicroWire followed the MicroSheath. J-wire, followed by a 5-French sheath and a catheter wire combination was negotiated into the inferior vena cava.  Sheath and catheter were removed.  A counter-incision was created at the skin insertion site and the dilator was passed over the wire. Dilator and peel-away sheath were then inserted, and a 27 tipped-cuff catheter was advanced over the wire through the sheath. Wire and sheath were then removed. Exit site on the chest wall was selected and lidocaine was infiltrated. A small incision was created and the tunneling device was passed from the exit site to the neck counter-incision. Catheter was then pulled subcutaneously, and under fluoroscopy, positioned the tips at the atriocaval junction. The hub was then connected and both lumens aspirated and flush easily. They were packed with  5000 units of heparin per lumen. The catheter was secured to the chest wall with 0 silk. The neck counter-incision was closed with 4-0 Monocryl and Dermabond was placed. A sterile dressing was applied. The patient tolerated the procedure well and there were no immediate complications.    ____________________________ Katha Cabal, MD ggs:dm D: 11/13/2012 08:24:00 ET T: 11/13/2012 10:23:38 ET JOB#: [id number] Dolores Lory SCHNIER MD ELECTRONICALLY SIGNED 11/19/2012 11:03

## 2014-10-08 NOTE — Op Note (Signed)
PATIENT NAME:  Angela Best, Angela Best MR#:  [id number] DATE OF BIRTH:  1966-02-18  DATE OF PROCEDURE:  07/14/2012  PREOPERATIVE DIAGNOSES: 1. End-stage renal disease.  2. Poorly functioning left arm AV graft.   POSTOPERATIVE DIAGNOSES:  1. End-stage renal disease.  2. Poorly functioning left arm AV graft.   PROCEDURES: 1. Ultrasound guidance for vascular access to left arm AV graft.  2. Left upper extremity shuntogram and central venogram.  3. Percutaneous transluminal angioplasty of venous anastomosis/axillary vein with 8 mm diameter angioplasty balloon.  4. Percutaneous transluminal angioplasty of mid graft stenosis with 8 mm diameter angioplasty balloon.   SURGEON: Algernon Huxley, MD   ANESTHESIA: Local with moderate conscious sedation.   ESTIMATED BLOOD LOSS: Approximately 25 mL.  FLUOROSCOPY TIME: Approximately 2 minutes.   CONTRAST USED:  20 mL.  INDICATION FOR PROCEDURE: This is a 49 year old African American female with end-stage renal disease. Her dialysis access flows have diminished and noninvasive studies show a very high-grade stenosis of the venous anastomosis. She is brought in for further evaluation. Risks and benefits are discussed and informed consent was obtained.   DESCRIPTION OF PROCEDURE: The patient was brought to the vascular interventional radiology suite. The left upper extremity was sterilely prepped and draped and a sterile surgical field was created. Ultrasound was used to visualize a patent graft just beyond the anastomosis. This was accessed under direct ultrasound guidance without difficulty with a micropuncture needle. Micropuncture wire and sheath were placed and then upsized to a 6 [place] sheath. The patient was given 2500 units of intravenous heparin for anticoagulation. Imaging was performed. This showed a moderate stenosis in the mid graft, in the 60% range. There was a pseudoaneurysm of the venous access site, that was moderate size, and then a very  high-grade stenosis was seen just beyond the previously placed venous anastomotic stent. This was in excess of 90%. The remainder of the central venous circulation was patent. I crossed the lesion without difficulty with a Magic torque wire. I treated the venous anastomotic stenosis and an 8 mm diameter angioplasty balloon was extended into the axillary vein. A tight waste was taken which resolved at about 12 to 14 atmospheres of pressure. This was held for a minute. I then angioplastied the mid graft stenosis where again a waste was seen. Completion angiogram following this showed resolution of the venous anastomotic and axillary vein stenosis and this was now widely patent without significant residual stenosis. The mid graft stenosis was improved, but not entirely resolved and remained in the 20% to 30% range. This was not flow limiting and at this point I elected to terminate the procedure. The sheath was removed around a 4-0 Monocryl pursestring suture, pressure was held and a sterile dressing was placed. The patient tolerated the procedure well and was taken to the recovery room in stable condition.  ____________________________ Algernon Huxley, MD jsd:sb D: 07/14/2012 09:07:43 ET T: 07/14/2012 09:16:42 ET JOB#: [id number]  cc: Algernon Huxley, MD, <Dictator> Algernon Huxley MD ELECTRONICALLY SIGNED 07/16/2012 7:34

## 2014-10-08 NOTE — Consult Note (Signed)
CHIEF COMPLAINT and HISTORY:  Subjective/Chief Complaint arm and face swelling   History of Present Illness Patient is admitted with left arm and face and neck swelling.  Has ESRD with a complicated history.  Had an attempt at declot about 3 weeks ago which was unsuccessful and left IJ permcath was placed.  This was done here.  She then went to [facility] and had a declot done a couple of weeks later.  Had her left IJ catheter in at that time.  This was then removed on Tuesday and her swelling started afterwards.  She then came back here for her convenience last night as it was closer as her swelling had worsened.  CT scan done which I have reviewed.  She has thrombosis of her left IJ, innominate and SVC.  I would suspect that the several week old thrombus from her fistula lodged in her central veins around the catheter at the time of thrombectomy.  Now that her catheter is removed and her left arm is being used for dialysis, her symptoms are worse.   PAST MEDICAL/SURGICAL HISTORY:  Past Medical History:   dialysis:    hemmorhoids:    hypertension:    Renal failure:    dialysis shunt left arm:   ALLERGIES:  Allergies:  Demerol: Itching  Calcitriol: Hives  Other -Explain in Comment Field: Hives  IVP Dye: Itching  Cephalosporins: Blisters  Morphine: Hives  Benadryl: Rash  Cefazolin: Rash  chlorhexidine topical: Itching  HOME MEDICATIONS:  Home Medications: Medication Instructions Status  calcium acetate 667 mg oral tablet 3 tab(s) orally 3 times a day with meals and 2 tab with snacks Active  Ambien 10 mg oral tablet 1 tab(s) orally once a day (at bedtime)as needed for sleep. Active  LORazepam 0.5 mg oral tablet 1 tab(s) orally 3 times a week on Tuesday, Thursday, and Saturday (dialysis days). Active  aspirin 81 mg oral tablet 2 tabs ($Remov'162mg'dEFQzN$ ) orally once a day. Active   Family and Social History:  Family History Coronary Artery Disease  Hypertension  Diabetes Mellitus   Social History  negative tobacco, negative ETOH   Place of Living Home   Review of Systems:  Fever/Chills No   Cough No   Sputum No   Abdominal Pain No   Diarrhea No   Constipation No   Nausea/Vomiting No   SOB/DOE No   Chest Pain No   Telemetry Reviewed NSR   Dysuria No   Tolerating PT Yes   Tolerating Diet Yes   Medications/Allergies Reviewed Medications/Allergies reviewed   Physical Exam:  GEN well developed, well nourished   HEENT left face swelling prominent.  Distended veins in the neck and chest seen   NECK trachea midline  left neck markedly swollen, right neck mild to moderately swollen   RESP normal resp effort  no use of accessory muscles   CARD regular rate  no JVD   VASCULAR ACCESS AV graft present  Good bruit  Good thrill   LYMPH negative neck, negative axillae   EXTR positive edema, LUE   SKIN normal to palpation   NEURO motor/sensory function intact   PSYCH alert, A+O to time, place, person   LABS:  Laboratory Results: LabObservation:    21-Jun-14 12:27, Echo Doppler  OBSERVATION   Reason for Test  Routine Chem:    20-Jun-14 93:79, Basic Metabolic Panel (w/Total Calcium)  Glucose, Serum 102  BUN 39  Creatinine (comp) 9.27  Sodium, Serum 139  Potassium, Serum 4.2  Chloride, Serum  100  CO2, Serum 30  Calcium (Total), Serum 9.1  Anion Gap 9  Osmolality (calc) 287  eGFR (African American) 5  eGFR (Non-African American) 5  eGFR values <14mL/min/1.73 m2 may be an indication of chronic  kidney disease (CKD).  Calculated eGFR is useful in patients with stable renal function.  The eGFR calculation will not be reliable in acutely ill patients  when serum creatinine is changing rapidly. It is not useful in   patients on dialysis. The eGFR calculation may not be applicable  to patients at the low and high extremes of body sizes, pregnant  women, and vegetarians.    20-Jun-14 16:34, B-Type Natriuretic Peptide [facility])  B-Type Natriuretic Peptide  [facility]) 2770  Result(s) reported on 05 Dec 2012 at 05:38PM.    21-Jun-14 36:62, Basic Metabolic Panel (w/Total Calcium)  Glucose, Serum 166  BUN 49  Creatinine (comp) 10.77  Sodium, Serum 139  Potassium, Serum 5.3  Chloride, Serum 99  CO2, Serum 31  Calcium (Total), Serum 8.6  Anion Gap 9  Osmolality (calc) 294  eGFR (African American) 4  eGFR (Non-African American) 4  eGFR values <23mL/min/1.73 m2 may be an indication of chronic  kidney disease (CKD).  Calculated eGFR is useful in patients with stable renal function.  The eGFR calculation will not be reliable in acutely ill patients  when serum creatinine is changing rapidly. It is not useful in   patients on dialysis. The eGFR calculation may not be applicable  to patients at the low and high extremes of body sizes, pregnant  women, and vegetarians.    21-Jun-14 03:07, Lipid Profile ([facility])  Cholesterol, Serum 293  Triglycerides, Serum 55  HDL (INHOUSE) 106  VLDL Cholesterol Calculated 11  LDL Cholesterol Calculated 176  Result(s) reported on 06 Dec 2012 at 03:45AM.  Cardiac:    20-Jun-14 16:34, Troponin I  Troponin I < 0.02  0.00-0.05  0.05 ng/mL or less: NEGATIVE   Repeat testing in 3-6 hrs   if clinically indicated.  >0.05 ng/mL: POTENTIAL   MYOCARDIAL INJURY. Repeat   testing in 3-6 hrs if   clinically indicated.  NOTE: An increase or decrease   of 30% or more on serial   testing suggests a   clinically important change  Routine Coag:    20-Jun-14 16:34, Activated PTT  Activated PTT (APTT) < 23.0  A HCT value >55% may artifactually increase the APTT. In one study,  the increase was an average of 19%.  Reference: "Effect on Routine and Special Coagulation Testing Values  of Citrate Anticoagulant Adjustment in Patients with High HCT Values."  American Journal of Clinical Pathology 2006;126:400-405.    20-Jun-14 16:34, Prothrombin Time  Prothrombin 12.9  INR 1.0  INR reference interval applies to patients on  anticoagulant therapy.  A single INR therapeutic range for coumarins is not optimal for all  indications; however, the suggested range for most indications is  2.0 - 3.0.  Exceptions to the INR Reference Range may include: Prosthetic heart  valves, acute myocardial infarction, prevention of myocardial  infarction, and combinations of aspirin and anticoagulant. The need  for a higher or lower target INR must be assessed individually.  Reference: The Pharmacology and Management of the Vitamin K   antagonists: the seventh ACCP Conference on Antithrombotic and  Thrombolytic Therapy. HUTML.4650 Sept:126 (3suppl): [id number].  A HCT value >55% may artifactually increase the PT.  In one study,   the increase was an average of 25%.  Reference:  "Effect on  Routine and Special Coagulation Testing Values  of Citrate Anticoagulant Adjustment in Patients with High HCT Values."  American Journal of Clinical Pathology 2006;126:400-405.    21-Jun-14 03:07, Activated PTT  Activated PTT (APTT) 150.5  A HCT value >55% may artifactually increase the APTT. In one study,  the increase was an average of 19%.  Reference: "Effect on Routine and Special Coagulation Testing Values  of Citrate Anticoagulant Adjustment in Patients with High HCT Values."  American Journal of Clinical Pathology 2006;126:400-405.    21-Jun-14 12:05, Activated PTT  Activated PTT (APTT) 83.9  A HCT value >55% may artifactually increase the APTT. In one study,  the increase was an average of 19%.  Reference: "Effect on Routine and Special Coagulation Testing Values  of Citrate Anticoagulant Adjustment in Patients with High HCT Values."  American Journal of Clinical Pathology 2006;126:400-405.  Routine Hem:    20-Jun-14 16:34, Hemogram, Platelet Count  WBC (CBC) 8.0  RBC (CBC) 3.07  Hemoglobin (CBC) 9.8  Hematocrit (CBC) 29.8  Platelet Count (CBC) 261  Result(s) reported on 05 Dec 2012 at 05:08PM.  MCV 97  MCH 31.9  MCHC 32.9   RDW 15.9    21-Jun-14 03:07, CBC Profile  WBC (CBC) 10.7  RBC (CBC) 2.84  Hemoglobin (CBC) 9.1  Hematocrit (CBC) 27.5  Platelet Count (CBC) 238  MCV 97  MCH 32.1  MCHC 33.2  RDW 15.8  Neutrophil % 90.4  Lymphocyte % 7.5  Monocyte % 1.3  Eosinophil % 0.2  Basophil % 0.6  Neutrophil # 9.7  Lymphocyte # 0.8  Monocyte # 0.1  Eosinophil # 0.0  Basophil # 0.1  Result(s) reported on 06 Dec 2012 at 03:40AM.   RADIOLOGY:  Radiology Results: XRay:    20-Jun-14 16:19, Chest PA and Lateral  Chest PA and Lateral  REASON FOR EXAM:    SOB  COMMENTS:       PROCEDURE: DXR - DXR CHEST PA (OR AP) AND LATERAL  - Dec 05 2012  4:19PM     RESULT: The lungs are clear. The cardiac silhouette and visualized bony   skeleton are unremarkable. Patient's has taken a shallow inspiration.    IMPRESSION:      1. Chest radiograph without evidence of acute cardiopulmonary disease.        Verified By: Mikki Santee, M.D., MD  [place]:    21-Jun-14 11:23, [place] Color Flow Doppler Upper Extrem Left (Arm)  [place] Color Flow Doppler Upper Extrem Left (Arm)  REASON FOR EXAM:    upper extremity swelling, look at subclavian vein  COMMENTS:       PROCEDURE: [place]  - US DOPPLER UP EXTR LEFT  - Dec 06 2012 11:23AM     RESULT: Left upper extremity deep venous Doppler ultrasound dated   12/06/2012.    Technique: Great grayscale, spectral waveform and color-flow Doppler   imaging was performed of the deep venous structures of the left upper   extremity.    Findings: Nonocclusive thrombus is appreciated within the left internal   jugular vein and proximal subclavian vein. Partial color filling of these   vessels is appreciated. Spectral waveform images also identified.  Remaining deep venous structures of the left upper extremity demonstrate   appropriate compressibility and no evidence of thrombus. The patient has   a history of a graft within the left upper extremity which appears patent.    IMPRESSION:   Nonocclusive thrombus within the left internal jugular vein   and proximal subclavian vein.  2. The patient's floor nurse informed Dr. Posey Pronto the attending physician   of these findings.        Verified By: Mikki Santee, M.D., MD  LabUnknown:    20-Jun-14 16:19, Chest PA and Lateral  PACS Image    21-Jun-14 10:28, CT Chest for Pulm Embolism With Contrast  PACS Image    21-Jun-14 11:23, [place] Color Flow Doppler Upper Extrem Left (Arm)  PACS Image  CT:    21-Jun-14 10:28, CT Chest for Pulm Embolism With Contrast  CT Chest for Pulm Embolism With Contrast  REASON FOR EXAM:    swelling upper chest and face- likely subclavian dvt  COMMENTS:       PROCEDURE: CT  - CT CHEST (FOR PE) W  - Dec 06 2012 10:28AM     RESULT: Chest CT dated 12/06/2012    Technique: Helical 3 mm sections were obtained the thoracic inlet through   the lung bases status post intravenous administration of 100 mL of   Isovue-370.    Findings: Multiple venous collaterals identified along the chest wall on   the right anteriorly and posteriorly. Collaterals also identified   extending out into the proximal portion of the right arm. The patient is   status post right arm injection. There is opacification appreciated     within the subclavian vein. The opacification diminishes proximally   within the vein prior to superior vena caval insertion. A partial filling   defect is identified within the superior vena cava as well as extending   into the innominate vein on the left. The filling defect extends into the   internal jugular vein on the left. Mid subclavian vein appears opacified.   There is no evidence of opacification within the internal jugular vein on   the right.    The mediastinum hilar regions and structures demonstrate no evidence of   mediastinal or hilar adenopathy. 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    There is no evidence of  filling defects within the main, lobar, or   segmental pulmonary arteries.  The lung parenchyma demonstrates trace bilateral effusions left greater   than right. The visualized upper abdominal viscera demonstrate no gross   abnormalities. There is no evidence of a thoracic aortic aneurysm.    The visualized upper abdominal viscera demonstrate no gross abnormalities.    IMPRESSION:   1. No CT evidence of pulmonary arterial embolic disease.  2. Findings concerning for thrombus within the innominate vein on the   left, the internal jugular vein on the left as well as the superior vena   cava. The internal jugular of the right his are opacified and there   findings concerning for obstruction considering the extensive venous   collaterals in the chest wall. The subclavian on the right appears   opacified to the level of the vena cava.  3. Alternatively the findings within the internal jugular vein on the     right may be due to lack of contrast opacification.  4. Correlation with patient's clinical status is recommended and if   clinically warranted vascular surgical interventional consultation   recommended.        Verified By: Mikki Santee, M.D., MD   ASSESSMENT AND PLAN:  Assessment/Admission Diagnosis central venous occlusion/thrombosis ESRD Left arm graft in place   Plan This is a very difficult problem.   She is appropriately on anticoagulation and this  will need to be continued for 6 months at least. she has a left arm graft as her access, and this can be used but her symptoms will worsen with its use. An attempt to reopen her central veins percutaneously would likely be of benefit.  This can be done some time later this week or next week.  Not sure this will be possible as this was likley old thrombus from her occluded graft last month.  If this is not possible, will eventually need thigh grafts when this graft fails, which it likely will as some point in the next few  months.  If we can open a channel in her central veins, may want to consider conversion to a HeRO graft in the future.   Discussed difficulties with patient today.   level 4   Electronic Signatures: Algernon Huxley (MD)  (Signed 21-Jun-14 13:03)  Authored: Chief Complaint and History, PAST MEDICAL/SURGICAL HISTORY, ALLERGIES, HOME MEDICATIONS, Family and Social History, Review of Systems, Physical Exam, LABS, RADIOLOGY, Assessment and Plan   Last Updated: 21-Jun-14 13:03 by Algernon Huxley (MD)

## 2014-10-08 NOTE — Op Note (Signed)
PATIENT NAME:  Angela Best, Angela Best MR#:  [id number] DATE OF BIRTH:  [birth year]  DATE OF PROCEDURE:  10/22/2012  PREOPERATIVE DIAGNOSIS: 1.  End-stage renal disease.  2.  Clotted left arm arteriovenous  graft.  3.  Hypertension.   POSTOPERATIVE DIAGNOSES: 1.  End-stage renal disease.  2.  Clotted left arm arteriovenous  graft.  3.  Hypertension.   PROCEDURE:   1.  Ultrasound guidance for vascular access to left arm AV graft in both an antegrade and retrograde fashion.  2.  Left upper extremity shuntogram and central venogram.  3.  Catheter-directed thrombolysis and thrombectomy of left arm AV graft.  4.  Percutaneous transluminal angioplasty of arterial anastomosis with 6 mm diameter angioplasty balloon.  5.  Percutaneous transluminal angioplasty of mid graft with 6 mm diameter angioplasty balloon.  6.  Percutaneous transluminal angioplasty of venous anastomosis with 6 mm diameter angioplasty balloon.   SURGEON: Algernon Huxley, M.D.   ANESTHESIA: Local with moderate conscious sedation.   ESTIMATED BLOOD LOSS: Approximately 50 mL   CONTRAST USED: Visipaque 60 mL.   FLUOROSCOPY TIME: 11 minutes.   INDICATION FOR PROCEDURE: This is a 49 year old African American female with end-stage renal disease. She has a clotted left arm arteriovenous graft; we are trying to salvage this.   DESCRIPTION OF PROCEDURE: The patient is brought to the vascular radiology suite. The left upper extremity was sterilely prepped and draped, and a sterile surgical field was created. The graft was accessed in both an antegrade and retrograde fashion crossing under direct ultrasound guidance due to the pulseless nature of the graft; 6 French sheaths were placed. The patient was systemically heparinized. Imaging showed a clotted graft.  I was able to get a  wire and catheter into the brachial artery and similarly into the axillary vein. Catheter directed thrombolysis with 4 mg of TPA was then instilled. She had an  occlusion of the brachial artery distal to her graft with large collaterals which had formed around this.  The venous anastomosis had what appeared to be about an 80% stenosis just distal to the previously placed stent in the vein. There was marked irregularity throughout multiple portions of graft and residual thrombus near the arterial anastomosis. After the TPA had dwelled, I performed angioplasty with a 6 mm diameter angioplasty balloon in the paraanastomotic area to the artery within the midportion of the graft for all the irregularities there and in the venous stenosis distal to the previously placed stent. Following this, the graft patency was restored and there was good palpable flow within the graft. There was still some irregularity throughout the body of the graft, but this was not flow limiting. 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  At this point, I terminated the procedure. The sheath was removed with a 4-0 Monocryl pursestring suture. Pressure was held. Sterile dressing was placed. The patient tolerated the procedure well and was taken to the recovery room in stable condition.     ____________________________ Algernon Huxley, MD jsd:rw D: 10/22/2012 13:35:00 ET T: 10/22/2012 13:49:04 ET JOB#: [id number]  cc: Algernon Huxley, MD, <Dictator> Algernon Huxley MD ELECTRONICALLY SIGNED 11/03/2012 13:48

## 2014-10-08 NOTE — Discharge Summary (Signed)
PATIENT NAME:  Angela Best, Angela Best MR#:  [id number] DATE OF BIRTH:  1965-11-21  DATE OF ADMISSION:  12/05/2012 DATE OF DISCHARGE:  12/14/2012  PRESENTING COMPLAINT: Swelling from the chest up.   DISCHARGE DIAGNOSES:  1.  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  2.  Fever, workup negative, resolved. No indication for antibiotics.  3.  End-stage renal disease, on hemodialysis.  4.  Anxiety.  5.  History of  seasonal allergies.  6.  Hypokalemia, resolved.   FOLLOWUP: With Dr. Lamonte Sakai your primary care physician for PT/INR check. Resume your hemodialysis as before. The patient's primary nephrologist is Dr. Trinda Pascal at [facility] nephrology.   DIET: Renal diet.   MEDICATIONS:  1.  Calcium acetate 667 mg 3 tablets 3 times a day with meals and 2 tablets with snacks.  2.  Ambien 10 mg at bedtime.  3.  Lorazepam 0.5 mg 3 times a week on Tuesday, Thursday and Saturday.  4.  Aspirin 81 mg p.o. daily.  5.  Warfarin 3.5 mg p.o daily at 11:00 a.m.  STUDIES: The patient's next draw of PT-INR will be done at hemodialysis session on Tuesday, and results will be followed by Dr. Lamonte Sakai. Hemoglobin and hematocrit is 9.3 and 28.0. Glucose is 131, sodium is 133, potassium is 4.1. PT-INR is 25.8 and 2.4.   BRIEF SUMMARY OF HOSPITAL COURSE: Angela Best is a 49 year old African American female with history of end-stage renal disease on hemodialysis, who comes in with:  1.  Acute SVC syndrome. The patient  was likely reactive to multiple catheters and multiple manipulations on her HD access. She was started initially on heparin drip and then Coumadin was introduced. The patient's INR is therapeutic. She will follow up with her primary care physician, Dr. Lamonte Sakai for PT-INR check.  2.  End-stage renal disease, on hemodialysis, which was continued during the hospital stay. She was seen by Dr. Holley Raring.  3.  Febrile with tachycardia, resolved. The patient was started on broad-spectrum antibiotics;  however, her fever workup remained negative. Her white count was improving. She remained afebrile. Antibiotics were discontinued.  4.  Anxiety. P.r.n. Ativan was given.  5.  History of seasonal allergies. It remained stable. The patient did have an episode of nasal bleed, which resolved with nasal steroids and saline.  6.  Hypokalemia, resolved after hemodialysis.  7.  Hospital stay otherwise remained stable.   CODE STATUS: The patient remained a full code.   TIME SPENT: 40 minutes.  ____________________________ Hart Rochester Posey Pronto, MD sap:aw D: 12/15/2012 07:21:21 ET T: 12/15/2012 07:42:07 ET JOB#: [id number]  cc: Joffrey Kerce A. Posey Pronto, MD, <Dictator> Perrin Maltese, MD Trinda Pascal, MD [place] Lilian Kapur, MD Ilda Basset MD ELECTRONICALLY SIGNED 01/01/2013 7:31

## 2014-10-08 NOTE — Op Note (Signed)
PATIENT NAME:  Angela Best, Angela Best MR#:  [id number] DATE OF BIRTH:  [birth year]  DATE OF PROCEDURE: 11/11/2012.   PREOPERATIVE DIAGNOSIS:  1.  Thrombosed right arm brachial axillary dialysis graft.  2.  End-stage renal disease, requiring hemodialysis.  3.  Central venous stenosis.   POSTOPERATIVE DIAGNOSIS: 1.  Thrombosed right arm brachial axillary dialysis graft.  2.  End-stage renal disease, requiring hemodialysis.  3.  Central venous stenosis.   PROCEDURES PERFORMED: 1.  Contrast injection, left arm brachial axillary dialysis graft.  2. Mechanical thrombectomy, left arm brachial axillary dialysis graft with Trerotola device; unsuccessful.  3.  Percutaneous transluminal angioplasty, venous outflow to 8 mm.  4.  Placement of left internal jugular tunneled catheter.   SURGEON: Civil engineer, contracting, M.D.   SEDATION: Precedex drip.   ACCESS:  1.  6-French sheath, left arm arteriovenous graft; antegrade.  2.  6-French sheath, left arm arteriovenous graft; retrograde.  CONTRAST USED: Isovue 35 mL.   FLUORO TIME: 8.7 minutes.   INDICATIONS: Ms Nestle is a 49 year old woman who presented to dialysis with thrombosis of her graft. She has had multiple interventions in the past.   Risks and benefits for attempts at declot as well as placement of Permacath were reviewed. All have agreed to proceed.   PROCEDURE: The patient was taken to special procedures, placed in the supine position. After adequate sedation was achieved she was positioned with her left arm extended, palm-upward, and otherwise supine. The left arm was then prepped and draped in sterile fashion. One-percent  lidocaine was infiltrated in the soft tissues and access to the AV graft was obtained with a micropuncture needle, MicroWire, followed by a MicroSheath, J wire, followed by a 6-French sheath. Hand injection of contrast then demonstrated thrombus within the graft. Catheter and wire were then negotiated, first into the axillary then  the subclavian for formal central views. Thrombus was noted extending in the axillary, almost up to the subclavian. Previously-placed stents are noted. Four-thousand units of heparin were given. A Trerotola device was opened onto the field and multiple passes were made. There appeared to be an impingement at the venous outflow which has previously been stented, and an 8 x 6 balloon was advanced over the wire and used to angioplasty this area. There was minimal improvement. Several more passes with the Trerotola were made, and there appeared to be reasonable clearance of the thrombus in the vein, although the axillary vein still appeared to be problematic. Therefore, a second sheath was inserted as described above in a retrograde direction, and the Trerotola device was advanced into the brachial artery. It was then opened, but not engaged, and pulled back into the graft. Multiple passes in this fashion were. There was still no flow. The KMP catheter and wire were negotiated into the brachial artery and there appeared to be impingement of the previously-placed stent within the artery and thrombus within the artery there. There were extensive collaterals reconstituting the trifurcation in the forearm.   Given the appearance of the graft as well as the axillary vein, it would take on the order of 4 to  5 stents to rectify both arterial and venous problems, and this would have a poor longevity, therefore I have elected to place a catheter and will investigate the right arm for potential future access.   The left neck was then prepped and draped in a sterile fashion. Ultrasound was placed in a sterile sleeve. Jugular vein was identified. It was echolucent and compressible, indicating patency. Image was  recorded for the permanent record. A micropuncture needle was inserted. MicroWire followed the MicroSheath. J-wire, followed by a 5-French sheath and a catheter wire combination was negotiated into the inferior vena  cava. Sheath and catheter were removed.  A counter-incision was created at the skin insertion site and the dilator was passed over the wire. Dilator and peel-away sheath were then inserted, and a 27 tipped-cuff catheter was advanced over the wire through the sheath. Wire and sheath were then removed. Exit site on the chest wall was selected and lidocaine was infiltrated. A small incision was created and the tunneling device was passed from the exit site to the neck counter-incision. Catheter was then pulled subcutaneously, and under fluoroscopy, positioned the tips at the atriocaval junction. The hub was then connected and both lumens aspirated and flush easily. They were packed with  5000 units of heparin per lumen. The catheter was secured to the chest wall with 0 silk. The neck counter-incision was closed with 4-0 Monocryl and Dermabond was placed. A sterile dressing was applied. The patient tolerated the procedure well and there were no immediate complications.    ____________________________ Katha Cabal, MD ggs:dm D: 11/13/2012 08:24:43 ET T: 11/13/2012 10:23:38 ET JOB#: [id number]  cc: Katha Cabal, MD, <Dictator>

## 2014-10-09 NOTE — H&P (Signed)
PATIENT NAME:  Angela Best, Angela Best MR#:  [id number] DATE OF BIRTH:  1966/03/07  DATE OF ADMISSION:  10/13/2013  PRIMARY CARE PHYSICIAN:  Dr. Lamonte Sakai.  PRIMARY NEPHROLOGIST:  At [facility] nephrology.   REFERRING EMERGENCY ROOM PHYSICIAN:  Dr. Corky Downs.   CHIEF COMPLAINT:  Bleeding from arteriovenous graft site.   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  Today after hemodialysis she was applying pressure to her graft, but the bleeding did not stop.  As soon as she relieved the pressure it started bleeding again and it continued like that, so they finally sent her to the Emergency Room.  Dr. Corky Downs spoke to Dr. Lucky Cowboy and he advised to put a pressure bandage on her forearm to prevent the graft bleeding.  Her INR was found to be 3.7, so she was given admission to hospitalist team to reverse this coagulopathy and then later on to do the fistulogram to check the patency of the fistula as it is currently under pressure dressing.  On further questioning, the patient denies any complaint.   REVIEW OF SYSTEMS:  CONSTITUTIONAL:  Negative for fever, fatigue, weakness, pain or weight loss.  EYES:  No blurring, double vision, discharge or redness.  EARS, NOSE, THROAT:  No tinnitus, ear pain or hearing loss.  RESPIRATORY:  No cough, wheezing, hemoptysis, or shortness of breath.  CARDIOVASCULAR:  No chest pain, orthopnea, edema, arrhythmia or palpitations.  GASTROINTESTINAL:  No nausea, vomiting, diarrhea, abdominal pain.  GENITOURINARY:  No dysuria, hematuria, or increased frequency.  ENDOCRINE:  No polyuria.  No nocturia.  No increased sweating.  SKIN:  No acne, rashes, or lesions.  MUSCULOSKELETAL:  No pain or swelling in the joints.  NEUROLOGICAL:  No  numbness, weakness, tremor or vertigo.  PSYCHIATRIC:  No anxiety, insomnia, bipolar disorder.   PAST MEDICAL HISTORY: 1.  End-stage renal disease on hemodialysis Tuesday, Thursday, and Saturday.  2.  Insomnia and anxiety.  3.  Jugular venous clot related to her dialysis catheter in the past in June 2014.   PAST SURGICAL HISTORY:  AV graft placement in left arm, declotting procedure was done in the past.  C-section, parathyroidectomy,   She had a cornea transplant and two tubal ligations.   SOCIAL HISTORY:  No smoking.  Rarely alcohol.  No drug use.  Work as a Agricultural engineer.   FAMILY HISTORY:  Father had stroke, diabetes and hypertension.  Mother had lymphoma.  Brother with cerebrovascular accident, diabetes, hypertension.  Brother is a dialysis patient with diabetes and hypertension.   HOME MEDICATIONS: 1.  Warfarin 10 mg oral once a day.  2.  Lorazepam 0.5 mg oral 3 times a week.  3.  Calcium acetate 667 mg oral tablet 3 tablets 3 times a day with meals and 2 tablets with snacks.   PHYSICAL EXAMINATION: VITAL SIGNS:  In the ER, temperature 97.9, pulse 71, respirations 18, blood pressure 90/69, and oxygen saturation 98% on room air.  GENERAL:  The patient is fully alert and oriented to time, place, and person.  Does not appear in any acute distress.  HEENT:  Head and neck atraumatic.  Conjunctivae pink.  Oral mucosa moist.  NECK:  Supple.  No JVD.  RESPIRATORY:  Bilateral clear and equal air entry.  CARDIOVASCULAR:  S1, S2 present,  regular, murmur present.  ABDOMEN:  Soft, nontender.  Bowel sounds present.  No organomegaly.  SKIN:  No rashes.  LEGS:  No edema.  NEUROLOGICAL:  Power five out of five.  Moves all four limbs.  JOINTS:  No swelling or tenderness.  PSYCHIATRIC:  Does not appear in any acute psychiatric illness.  On left arm, there is a pressure dressing with elastic bandage present on her AV graft site.   IMPORTANT LABORATORY RESULTS:  Glucose 79, BUN 33,  creatinine 8.21, sodium 139, potassium 3.6, chloride 96, CO2 34, anion gap is 9.  Total protein is 9, bilirubin is 0.3, alkaline phosphate 126, SGPT 28.  WBC is 4.7, hemoglobin is 11.5, platelet count is 247.  MCV is 100.  INR is 3.7.   ASSESSMENT AND PLAN:  A 49 year old female with past history of deep vein thrombosis, jugular vein thrombosis and end stage renal disease on hemodialysis had bleeding from her AV graft after dialysis today and found having INR 3.7.  1.  Coagulopathy and bleeding.  The patient is on Coumadin for jugular vein thrombosis.  I spoke to Dr. Lucky Cowboy and he feels that in catheter-related thrombosis like this she does not need to be on anticoagulation for more than six months, so advised to stop it.  We will need to reverse it right now because of this bleeding issue.  Bleeding is currently mechanically stopped with a pressure bandage on her forearm.  We will give vitamin K oral one dose and check INR tomorrow morning.   2.  End-stage renal disease on hemodialysis.  The patient has bleeding from her AV graft so currently it is under pressure bandage to stop bleeding.  Dr. Lucky Cowboy was supposed to do tomorrow morning fistulogram to check the patency once it gets clotted if he might need also to do declotting and if not successful then may need to place a temporary dialysis access  3.  Jugular venous thromboses.  As mentioned above, the patient already took 11 months of anticoagulation therapy so no more needed.  4.  Chronic anemia.  This is dialysis related.  Currently the patient had an episode of bleeding.  We will check CBC tomorrow morning again if needed, then might need to get transfusion.  If does not much then she will be managed at dialysis center.   Total time spent in this admission 50 minutes.    ____________________________ Ceasar Lund Anselm Jungling, MD vgv:ea D: 10/13/2013 16:36:43 ET T: 10/13/2013 17:13:01 ET JOB#: [id number]  cc: Ceasar Lund. Anselm Jungling, MD,  <Dictator> Perrin Maltese, MD [facility] Vaughan Basta MD ELECTRONICALLY SIGNED 10/26/2013 22:16

## 2014-10-09 NOTE — Op Note (Signed)
PATIENT NAME:  Angela Best, Angela Best MR#:  [id number] DATE OF BIRTH:  [birth year]  DATE OF PROCEDURE:  10/14/2013  PREOPERATIVE DIAGNOSES: 1.  End-stage renal disease.  2.  Prolonged bleeding, left arm arteriovenous graft.  3.  History of central venous thrombosis.  4.  Hypertension.   POSTOPERATIVE DIAGNOSES:   1.  End-stage renal disease.  2.  Prolonged bleeding, left arm arteriovenous graft.  3.  History of central venous thrombosis.  4.  Hypertension.   PROCEDURES PERFORMED: 1.  Ultrasound guidance for vascular access to left arm AV graft.  2.  Left upper extremity shuntogram and central venogram.  3.  Percutaneous transluminal angioplasty with 6 mm diameter Lutonix and 8 mm diameter conventional angioplasty balloon to the venous anastomosis.  4.  Percutaneous transluminal angioplasty of the superior vena cava with 8 and 12 mm diameter angioplasty balloons.   SURGEON: Algernon Huxley, M.D.   ANESTHESIA: Local with moderate conscious sedation.   ESTIMATED BLOOD LOSS: Minimal.   INDICATION FOR PROCEDURE: A 49 year old [place] American female with end-stage renal disease. She was admitted yesterday with severe bleeding from her graft, in addition to coagulopathy from Coumadin.  Her graft felt quite pulsatile. She has a history of central venous thrombosis. Shuntogram was performed for further evaluation.   DESCRIPTION OF PROCEDURE: The patient is brought to the vascular suite. The left upper extremity was sterilely prepped and draped in a sterile surgical field was created. The graft was accessed about 3 to 4 cm beyond the arterial anastomosis. Under direct ultrasound guidance with a micropuncture needle, micropuncture wire and sheath were placed. A permanent image was recorded.  A 6-French sheath was then placed. Imaging was performed showing a near occlusive stenosis just past the stent at the venous anastomosis in the axillary vein. In addition, the superior vena cava appeared to have a  tapered stenosis in its mid segment that was about 60% to 70%, and with her history of central venous thrombosis, I felt this was likely a real finding and not just  artifact from motion. The patient was given a small dose of intravenous heparin. Magic Torque wire and Kumpe catheter were used to cross both stenoses. The 6 mm diameter Lutonix angioplasty balloon was inflated at the venous anastomosis and then an 8 mm diameter angioplasty balloon used. With the balloon inflated, we opacified the arterial anastomosis. The brachial artery beyond the graft was occluded. This was not a new finding and was well collateralized. I had not performed a previous angiography, so I am not sure when this was initially seen. Completion angiogram of venous anastomosis showed this area now to be widely patent with about a 20% residual stenosis. I then treated the central venous circulation. I used initially the 8 mm balloon just to see if there was a waist and there was. I then upsized to a 12 mm balloon to match the size of the vein closer. Waist was taken, which resolved with angioplasty. Completion angiogram following this showed about a 30% residual stenosis, which was not flow limiting. At this point, I elected to terminate the procedure. The sheath was removed. A 4-0 Monocryl pursestring suture was placed. Pressure was held. Sterile dressing was placed. The patient tolerated the procedure well and was taken to the recovery room in stable condition.       ____________________________ Algernon Huxley, MD jsd:dmm D: 10/14/2013 17:21:03 ET T: 10/14/2013 20:21:27 ET JOB#: [id number]  cc: Algernon Huxley, MD, <Dictator> Algernon Huxley MD ELECTRONICALLY  SIGNED 10/22/2013 14:22

## 2014-10-09 NOTE — Consult Note (Signed)
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  Admitted yesterday with an INR of 3.7 and a bleeding AVG.  The bleeding was controlled with pressure, and it is not bleeding today.  Her INR was reversed and is now 1.5.  Left arm is a little swollen and painful.  Graft was working well up until recently.   PAST MEDICAL/SURGICAL HISTORY:  Past Medical History:   dialysis:    hemmorhoids:    hypertension:    Renal failure:    dialysis shunt left arm:   ALLERGIES:  Allergies:  Demerol: Itching  Calcitriol: Hives  Other -Explain in Comment Field: Hives  IVP Dye: Itching  Cephalosporins: Blisters  Morphine: Hives  Benadryl: Rash  Cefazolin: Rash  chlorhexidine topical: Itching  HOME MEDICATIONS:  Home Medications: Medication Instructions Status  calcium acetate 667 mg oral tablet 3 tab(s) orally 3 times a day with meals and 2 tab with snacks Active  LORazepam 0.5 mg oral tablet 1 tab(s) orally 3 times a week on Tuesday, Thursday, and Saturday (dialysis days). Active  warfarin 10 mg oral tablet 1 tab(s) orally once a day Active   Family and Social History:  Family History Non-Contributory   Social History negative tobacco, negative ETOH   Place of Living Home   Review of Systems:  Fever/Chills No   Cough No   Sputum No   Abdominal Pain No   Diarrhea No   Constipation No   Nausea/Vomiting No   SOB/DOE No   Chest Pain No   Telemetry Reviewed NSR   Dysuria esrd   Tolerating PT Yes   Tolerating Diet Yes   Medications/Allergies Reviewed Medications/Allergies reviewed   Physical Exam:  GEN well developed, well nourished   HEENT pink conjunctivae, moist oral mucosa   NECK No masses  trachea midline   RESP normal resp effort  no use of accessory muscles   CARD regular rate  no JVD   VASCULAR  ACCESS AV graft present  graft is patent but pulsatile.  No active bleeding   ABD denies tenderness  soft   LYMPH negative neck, negative axillae   EXTR negative cyanosis/clubbing, mile LUE edema   SKIN normal to palpation, skin turgor good   NEURO cranial nerves intact, motor/sensory function intact   PSYCH alert, A+O to time, place, person   LABS:  Laboratory Results: Hepatic:    28-Apr-15 14:02, Comprehensive Metabolic Panel  Bilirubin, Total 0.3  Alkaline Phosphatase 126  45-117  NOTE: New Reference Range  05/08/13  SGPT (ALT) 28  SGOT (AST) 27  Total Protein, Serum 9.0  Albumin, Serum 4.5  Routine BB:    28-Apr-15 14:02, Type and Antibody Screen  ABO Group + Rh Type   A Positive  Antibody Screen NEGATIVE  Result(s) reported on 13 Oct 2013 at 03:04PM.  Cardiology:    28-Apr-15 16:16, ECG  Ventricular Rate 80  Atrial Rate 80  P-R Interval 148  QRS Duration 70  QT 446  QTc 514  P Axis 56  R Axis -16  T Axis 45  ECG interpretation   Normal sinus rhythm  Low voltage QRS  Cannot rule out Anterior infarct (cited on or before 27-Aug-2010)  Prolonged QT  Abnormal ECG  When compared with ECG of 05-Dec-2012 16:10,  Borderline criteria for Inferior infarct are no longer Present  Nonspecific T wave abnormality, worse  in Anterior leads  ----------unconfirmed----------  Confirmed by OVERREAD, NOT ([id number]), editor PEARSON, BARBARA ([id number]) on 10/14/2013 10:29:31 AM  ECG   Routine Chem:    28-Apr-15 14:02, Activated PTT  Result Comment   APTT - RESULTS VERIFIED BY REPEAT TESTING.   - NOTIFIED OF CRITICAL VALUE   - C/CINDY HALE 10/13/13 AT 1455 BY HS   - READ-BACK PROCESS PERFORMED.   Result(s) reported on 13 Oct 2013 at 02:55PM.    28-Apr-15 14:02, Comprehensive Metabolic Panel  Glucose, Serum 79  BUN 33  Creatinine (comp) 8.21  Sodium, Serum 139  Potassium, Serum 3.6  Chloride, Serum 96  CO2, Serum 34  Calcium (Total), Serum 9.0  Osmolality (calc) 284  eGFR  (African American) 6  eGFR (Non-African American) 5  eGFR values <23mL/min/1.73 m2 may be an indication of chronic  kidney disease (CKD).  Calculated eGFR is useful in patients with stable renal function.  The eGFR calculation will not be reliable in acutely ill patients  when serum creatinine is changing rapidly. It is not useful in   patients on dialysis. The eGFR calculation may not be applicable  to patients at the low and high extremes of body sizes, pregnant  women, and vegetarians.  Anion Gap 9  Routine Coag:    28-Apr-15 14:02, Activated PTT  Activated PTT (APTT) 65.9  A HCT value >55% may artifactually increase the APTT. In one study,  the increase was an average of 19%.  Reference: "Effect on Routine and Special Coagulation Testing Values  of Citrate Anticoagulant Adjustment in Patients with High HCT Values."  American Journal of Clinical Pathology 2006;126:400-405.    28-Apr-15 14:02, Prothrombin Time  Prothrombin 35.5  INR 3.7  INR reference interval applies to patients on anticoagulant therapy.  A single INR therapeutic range for coumarins is not optimal for all  indications; however, the suggested range for most indications is  2.0 - 3.0.  Exceptions to the INR Reference Range may include: Prosthetic heart  valves, acute myocardial infarction, prevention of myocardial  infarction, and combinations of aspirin and anticoagulant. The need  for a higher or lower target INR must be assessed individually.  Reference: The Pharmacology and Management of the Vitamin K   antagonists: the seventh ACCP Conference on Antithrombotic and  Thrombolytic Therapy. NOBSJ.6283 Sept:126 (3suppl): [id number].  A HCT value >55% may artifactually increase the PT.  In one study,   the increase was an average of 25%.  Reference:  "Effect on Routine and Special Coagulation Testing Values  of Citrate Anticoagulant Adjustment in Patients with High HCT Values."  American Journal of Clinical  Pathology 2006;126:400-405.    29-Apr-15 04:19, Prothrombin Time  Prothrombin 17.4  INR 1.5  INR reference interval applies to patients on anticoagulant therapy.  A single INR therapeutic range for coumarins is not optimal for all  indications; however, the suggested range for most indications is  2.0 - 3.0.  Exceptions to the INR Reference Range may include: Prosthetic heart  valves, acute myocardial infarction, prevention of myocardial  infarction, and combinations of aspirin and anticoagulant. The need  for a higher or lower target INR must be assessed individually.  Reference: The Pharmacology and Management of the Vitamin K   antagonists: the seventh ACCP Conference on Antithrombotic and  Thrombolytic Therapy. [id number] Sept:126 (3suppl): N9146842.  A HCT value >55% may artifactually increase the PT.  In one study,   the increase was an average of 25%.  Reference:  "Effect on Routine and Special Coagulation  Testing Values  of Citrate Anticoagulant Adjustment in Patients with High HCT Values."  American Journal of Clinical Pathology 2006;126:400-405.  Routine Hem:    28-Apr-15 14:02, Hemogram, Platelet Count  WBC (CBC) 4.7  RBC (CBC) 3.60  Hemoglobin (CBC) 11.5  Hematocrit (CBC) 35.8  Platelet Count (CBC) 247  Result(s) reported on 13 Oct 2013 at 02:24PM.  MCV 100  MCH 32.1  MCHC 32.2  RDW 15.4    29-Apr-15 04:19, CBC Profile  WBC (CBC) 5.0  RBC (CBC) 3.22  Hemoglobin (CBC) 10.4  Hematocrit (CBC) 32.2  Platelet Count (CBC) 221  MCV 100  MCH 32.3  MCHC 32.4  RDW 15.3  Neutrophil % 54.8  Lymphocyte % 30.3  Monocyte % 12.1  Eosinophil % 1.7  Basophil % 1.1  Neutrophil # 2.7  Lymphocyte # 1.5  Monocyte # 0.6  Eosinophil # 0.1  Basophil # 0.1  Result(s) reported on 14 Oct 2013 at 04:58AM.   RADIOLOGY:  Radiology Results: XRay:    20-Jun-14 16:19, Chest PA and Lateral  Chest PA and Lateral  REASON FOR EXAM:    SOB  COMMENTS:       PROCEDURE: DXR - DXR  CHEST PA (OR AP) AND LATERAL  - Dec 05 2012  4:19PM     RESULT: The lungs are clear. The cardiac silhouette and visualized bony   skeleton are unremarkable. Patient's has taken a shallow inspiration.    IMPRESSION:      1. Chest radiograph without evidence of acute cardiopulmonary disease.        Verified By: Mikki Santee, M.D., MD    27-Jun-14 10:00, Chest Portable Single View  Chest Portable Single View  REASON FOR EXAM:    SOB, tachy  COMMENTS:       PROCEDURE: DXR - DXR PORTABLE CHEST SINGLE VIEW  - Dec 12 2012 10:00AM     RESULT: Comparison is made to the study of 12/05/2012.    There is persistent widening of the mediastinum. Heart is nonenlarged.  There is some left lung base atelectasis suspected. Followup PA and   lateral views are recommended.    IMPRESSION:  Please see above.    Dictation Site: 2    Verified By: Sundra Aland, M.D., MD  [place]:    21-Jun-14 11:23, [place] Color Flow Doppler Upper Extrem Left (Arm)  [place] Color Flow Doppler Upper Extrem Left (Arm)  REASON FOR EXAM:    upper extremity swelling, look at subclavian vein  COMMENTS:       PROCEDURE: [place]  - US DOPPLER UP EXTR LEFT  - Dec 06 2012 11:23AM     RESULT: Left upper extremity deep venous Doppler ultrasound dated   12/06/2012.    Technique: Great grayscale, spectral waveform and color-flow Doppler   imaging was performed of the deep venous structures of the left upper   extremity.    Findings: Nonocclusive thrombus is appreciated within the left internal   jugular vein and proximal subclavian vein. Partial color filling of these   vessels is appreciated. Spectral waveform images also identified.  Remaining deep venous structures of the left upper extremity demonstrate   appropriate compressibility and no evidence of thrombus. The patient has   a history of a graft within the left upper extremity which appears patent.    IMPRESSION:  Nonocclusive thrombus within the left internal jugular vein    and proximal subclavian vein.   2. The patient's floor nurse informed Dr. Posey Pronto the attending physician  of these findings.        Verified By: Mikki Santee, M.D., MD  LabUnknown:    20-Jun-14 16:19, Chest PA and Lateral  PACS Image    21-Jun-14 10:28, CT Chest for Pulm Embolism With Contrast  PACS Image    21-Jun-14 11:23, [place] Color Flow Doppler Upper Extrem Left (Arm)  PACS Image    27-Jun-14 10:00, Chest Portable Single View  PACS Image    16-Jul-14 09:41, Digital Diagnostic Mammogram Bilateral  PACS Image  CT:    21-Jun-14 10:28, CT Chest for Pulm Embolism With Contrast  CT Chest for Pulm Embolism With Contrast  REASON FOR EXAM:    swelling upper chest and face- likely subclavian dvt  COMMENTS:       PROCEDURE: CT  - CT CHEST (FOR PE) W  - Dec 06 2012 10:28AM     RESULT: Chest CT dated 12/06/2012    Technique: Helical 3 mm sections were obtained the thoracic inlet through   the lung bases status post intravenous administration of 100 mL of   Isovue-370.    Findings: Multiple venous collaterals identified along the chest wall on   the right anteriorly and posteriorly. Collaterals also identified   extending out into the proximal portion of the right arm. The patient is   status post right arm injection. There is opacification appreciated     within the subclavian vein. The opacification diminishes proximally   within the vein prior to superior vena caval insertion. A partial filling   defect is identified within the superior vena cava as well as extending   into the innominate vein on the left. The filling defect extends into the   internal jugular vein on the left. Mid subclavian vein appears opacified.   There is no evidence of opacification within the internal jugular vein on   the right.    The mediastinum hilar regions and structures demonstrate no evidence of   mediastinal or hilar adenopathy. 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    There is no evidence of filling defects within the main, lobar, or   segmental pulmonary arteries.  The lung parenchyma demonstrates trace bilateral effusions left greater   than right. The visualized upper abdominal viscera demonstrate no gross   abnormalities. There is no evidence of a thoracic aortic aneurysm.    The visualized upper abdominal viscera demonstrate no gross abnormalities.    IMPRESSION:   1. No CT evidence of pulmonary arterial embolic disease.  2. Findings concerning for thrombus within the innominate vein on the   left, the internal jugular vein on the left as well as the superior vena   cava. The internal jugular of the right his are opacified and there   findings concerning for obstruction considering the extensive venous   collaterals in the chest wall. The subclavian on the right appears   opacified to the level of the vena cava.  3. Alternatively the findings within the internal jugular vein on the     right may be due to lack of contrast opacification.  4. Correlation with patient's clinical status is recommended and if   clinically warranted vascular surgical interventional consultation   recommended.        Verified By: Mikki Santee, M.D., MD  [facility]:    16-Jul-14 09:41, Digital Diagnostic Mammogram Bilateral  Digital Diagnostic Mammogram Bilateral  REASON FOR EXAM:  BILATERAL SWELLING HEAVINESS  COMMENTS:       PROCEDURE: MAM - MAM DGTL DIAGNOSTIC MAMMO W/CAD  - Dec 31 2012  9:41AM     RESULT: Comparison made to prior study of 06/15/2010. Breast are   bilaterally dense with significant skin thickening. Venous distention is   present. Although these findings could be a sign of breast malignancy a   systemic process is most likely etiology. By history the patient has SVC   syndrome. This could cause this finding. Until resolution this mammogram   be classified BI-RADS category  4.    IMPRESSION:  Diffuse breast increased density with soft tissue swelling   and venous distention bilaterally. These findings are most consistent   patient's history of SVC syndrome. Underlying malignancy cannot be     excluded and repeat exam would be needed to demonstrate resolution of   these findings.    BI-RADS: Category 4 - Suspicious Abnormality - Biopsy Should Be Considered      Thank you for the oppurtunity to contribute to the care of your patient.     BREAST COMPOSITION: The breast composition is EXTREMELY DENSE (glandular   tissue greater than 75%) This decreases the sensitivity of mammography.        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 Skin is not threatened   Plan discussed with the patient that the AVG is threatened, and I would not use it until it was assessed with a shuntogram.  Suspect a central venous issue causing increased pressure as well as her elevated INR causing bleeding.  INR 1.5 today.  Would recommend shuntogram today and possible intervention.  Not sure she needs to remain on anticoagulation long term, but would see what results of shuntogram are and decide from there.   level 4   Electronic Signatures: Algernon Huxley (MD)  (Signed 29-Apr-15 13:31)  Authored: Chief Complaint and History, PAST MEDICAL/SURGICAL HISTORY, ALLERGIES, HOME MEDICATIONS, Family and Social History, Review of Systems, Physical Exam, LABS, RADIOLOGY, Assessment and Plan   Last Updated: 29-Apr-15 13:31 by Algernon Huxley (MD)

## 2014-10-10 NOTE — Op Note (Signed)
PATIENT NAME:  Angela Best, Angela Best MR#:  [id number] DATE OF BIRTH:  [birth year]  DATE OF PROCEDURE:  07/04/2011  PREOPERATIVE DIAGNOSIS: Thrombosis of left arm brachial axillary dialysis graft.   POSTOPERATIVE DIAGNOSIS: Thrombosis of left arm brachial axillary dialysis graft.   PROCEDURES PERFORMED:  1. Contrast injection left arm brachial axillary dialysis graft.  2. Mechanical thrombectomy left arm brachial axillary dialysis graft.  3. Percutaneous transluminal angioplasty of the axillary vein and previously placed stent.  4. Percutaneous transluminal angioplasty of the venous cannulation site. (Separate and distinct lesion from axillary vein).  5. Percutaneous transluminal angioplasty arterial anastomosis and previously placed stent at this level.   SURGEON: Hortencia Pilar, M.D.   SEDATION: Versed 5 mg plus fentanyl 150 mcg administered IV. Continuous ECG, pulse oximetry and cardiopulmonary monitoring was performed throughout the entire procedure by the interventional radiology nurse. Total sedation time was one hour, 50 minutes.   ACCESS:  1. 6 French sheath, antegrade direction, left arm brachial axillary dialysis graft.  2. 6 French sheath, retrograde direction, left arm brachial axillary dialysis graft.   CONTRAST USED: Isovue 60 mL.  FLUORO TIME: 6.1 minutes.   INDICATIONS: Angela Best is a 49 year old woman who presented to the hospital with thrombosis of her left arm brachial axillary dialysis graft. She was found to have life-threatening hyperkalemia and has subsequently undergone medical treatment for this. She is now being returned to special procedures for evaluation of her access so that dialysis therapy may be continued. The risks and benefits of treating her thrombosed graft were reviewed with her. All questions answered. Alternative therapies were reviewed. The patient has agreed to proceed.   DESCRIPTION OF PROCEDURE: The patient is taken to special procedures and placed  in the supine position. After adequate sedation is achieved, her left arm is prepped and draped in a sterile fashion and extended palm upward. The left arm is anesthetized with 1% lidocaine and access to the graft is obtained in an antegrade direction near the antecubital fossa. Micropuncture needle is utilized, microwire followed by micro sheath, J-wire followed by a 6 [place] sheath. Hand injection of contrast demonstrates thrombus within the graft. 4,000 units of heparin are then given. KMP catheter and wire are then negotiated into the central venous system where central venous anatomy is evaluated and noted to be widely patent from the proximal axillary to the atrium. As noted, heparin is given. Trerotola device is opened onto the field and multiple passes are then used to free thrombus from the venous portion of the graft. After several passes, several hemodynamically significant abnormalities are uncovered, one more proximal to the previously placed stent within the mid axillary vein. The second area in the venous cannulation access sites is separate and distinct from the axillary lesion.   1% lidocaine is infiltrated in the soft tissues overlying the graft. A micropuncture needle is then inserted in a retrograde direction at the level of the deltoid and wire is negotiated toward the arterial anastomosis. Again, micro sheath followed by J-wire followed by a 6 French sheath is inserted. Trerotola device is then negotiated into the arterial system. The basket is opened, but it is not deployed until the anastomosis crossed and then multiple passes with the Trerotola device are made clearing thrombus from the arterial portion of the graft. Follow-up angiography utilizing a KMP catheter has been advanced over the wire into the arterial system itself demonstrates a critical stricture at the distal margin of the stent. It has already been placed near the  arterial puncture site.   First, a 7 x 4 balloon is used to  angioplasty both the axillary lesion and the mid venous lesion. Second angioplasty is performed in the mid venous lesion. The axillary lesion is not well treated; however, the mid venous lesion on follow-up angiography is well treated. Therefore, an 8 x 4 balloon this time utilizing a conquest balloon is utilized to angioplasty the axillary lesion. Follow-up angiography demonstrates a nice result with less than 20% residual stenosis. Wire is then reintroduced in the retrograde sheath down into the arterial system and the 7 x 4 mm balloon advanced across the arterial stricture and this is ballooned to 14 atmospheres for one minute. 7 mm balloon inflations in the venous portion were also to 14 atmospheres. The conquest balloon in the axillary vein was inflated to 24 atmospheres.   Follow-up angiography demonstrates resolution of the arterial stricture as well. There is now rapid flow of contrast. There is no residual thrombus identified. Central venous anatomy remains widely patent.   Pursestring sutures of 4-0 Monocryl are placed around both sheaths. The sheaths are removed. Light pressure is held.   There are no immediate complications.   INTERPRETATION: Initial views demonstrate thrombus within the graft. Once thrombus has been removed with Trerotola, three separate and distinct lesions, one in the arterial, one at the venous cannulation site and one in the mid axillary vein are identified. Central venous anatomy is widely patent. Brachial artery appears patent in its visualized portions. Following the angioplasty, as described above, there is resolution of these strictures.   SUMMARY: Successful salvage of left arm brachial axillary dialysis graft as described above.   ____________________________ Katha Cabal, MD ggs:ap D: 07/07/2011 12:26:13 ET T: 07/07/2011 13:05:09 ET JOB#: [id number]  cc: Katha Cabal, MD, <Dictator> Katha Cabal MD ELECTRONICALLY SIGNED 08/01/2011 11:08

## 2014-10-10 NOTE — Op Note (Signed)
PATIENT NAME:  Angela Best, Angela Best MR#:  [id number] DATE OF BIRTH:  1965-09-17  DATE OF PROCEDURE:  06/18/2011  PREOPERATIVE DIAGNOSES:  1. End-stage renal disease.  2. Clotted left upper arm AV graft.   POSTOPERATIVE DIAGNOSES: 1. End-stage renal disease.  2. Clotted left upper arm AV graft.   PROCEDURES PERFORMED: 1. Ultrasound guidance for vascular access to the graft in both an antegrade and retrograde fashion, crossing.  2. Left upper extremity shuntogram and central venogram.  3. Catheter directed thrombolysis with 4 mg of TPA with the AngioJet AVX catheter to the graft.  4. Mechanical rheolytic thrombectomy to the graft.  5. Percutaneous transluminal angioplasty of the arterial anastomosis for residual arterial plug causing high-grade stenosis after catheter-directed thrombolysis and thrombectomy.  6. Fogarty embolectomy for residual plug after angioplasty, thrombolysis, and thrombectomy.   SURGEON: Algernon Huxley, M.D.   ANESTHESIA: Local with moderate conscious sedation.   ESTIMATED BLOOD LOSS: Approximately 25 mL.   INDICATION FOR PROCEDURE: This is a 49 year old African American female who is new to our practice who we were called about today for a clotted graft. She is brought in for attempt at salvage. The risks and benefits were discussed and informed consent was obtained.   DESCRIPTION OF PROCEDURE: The patient was brought to the vascular interventional radiology suite. The left upper extremity was sterilely prepped and draped and a sterile surgical field was created. The ultrasound machine was used to access the graft in an antegrade and retrograde fashion crossing. We up-sized to 6 [place] sheaths. The patient was given 3000 units of intravenous heparin for systemic anticoagulation. A Magic torque wire was placed beyond both the arterial and venous anastomoses. Imaging showed thrombosis of the graft. I then performed catheter-directed thrombolysis from the brachial artery back  to the stent, at the venous anastomosis, with 4 mg of TPA delivered with the AngioJet AVX catheter. This was allowed to dwell for 15 to 20 minutes and mechanical rheolytic thrombectomy was performed over similar locations. Following this there was found to be a reasonably old graft that was moderately irregular, but without significant stenosis, except a high-grade stenosis due to an arterial plug just beyond the arterial anastomosis. I balloon-angioplastied this with a 7 mm diameter angioplasty balloon with improvement, but not resolution of the thrombosis, and a Fogarty embolectomy was then performed which did liberate this and we pulled out the remainder of the clot with the AngioJet AVX catheter. At this point, both sheaths were removed over 4-0 Monocryl pursestring sutures, pressure was held, and a sterile dressing was placed. The patient tolerated the procedure well and was taken to the recovery room in stable condition. ____________________________ Algernon Huxley, MD jsd:slb D: 06/18/2011 13:00:00 ET T: 06/18/2011 13:47:19 ET JOB#: [id number]  cc: Algernon Huxley, MD, <Dictator> Algernon Huxley MD ELECTRONICALLY SIGNED 07/06/2011 7:56

## 2014-10-10 NOTE — H&P (Signed)
PATIENT NAME:  Angela Best, Angela Best MR#:  [id number] DATE OF BIRTH:  24-May-1966  DATE OF ADMISSION:  07/03/2011  PRIMARY CARE PHYSICIAN: Rudi Coco, MD    REFERRING PHYSICIAN:  Lavonia Dana, MD    CHIEF COMPLAINT: Hyperkalemia.   HISTORY OF PRESENT ILLNESS: The patient is a 49 year old African American female with past medical history significant for end-stage renal disease, history of hypertension, hemorrhoids, who presents to the hospital for outpatient procedure to have her AV access declotted. Prior to the procedure, she had a potassium checked that was 7.0. A stat Nephrology consult was obtained by the Vascular surgeon. The patient was given Kayexalate. Her potassium has decreased to 6.4. I am asked to admit the patient for hospitalization, and the patient will have the declotting procedure in the morning. She reports that she did her normal dialysis on Saturday, otherwise has been feeling well. She denies any fevers or chills. No chest pain. No shortness of breath. No abdominal pain, nausea, vomiting, diarrhea, or any urinary symptoms.   PAST MEDICAL HISTORY:  1. History of end-stage renal disease, status post tunnel hemodialysis catheter in the past, now only has an AV fistula in the left arm which is currently clotted off.  2. End-stage renal disease on hemodialysis Tuesday, Thursday, Saturday.  3. History of hypertension, currently not on any antihypertensives.  4. History of hemorrhoids.   PAST SURGICAL HISTORY:  1. Status post corneal transplant.  2. Two cesarean sections.  3. Tubal ligation.  4. Status post arterial venogram of the left arm.   ALLERGIES: Morphine, Demerol, calcitriol, Benadryl, cefazolin, IV contrast.   SOCIAL HISTORY: The patient is married, has two children. She denies smoking or any alcohol abuse.  FAMILY HISTORY: Mother had breast cancer. Her father died of a cerebrovascular accident. Her brother had a cerebrovascular accident.   REVIEW OF SYSTEMS:  CONSTITUTIONAL: Denies any fevers. No fatigue. No weakness. No pain. No weight loss. No weight gain. EYES: No blurred or double vision. No pain. No redness. No inflammation. No glaucoma, no cataracts. ENT: Denies any tinnitus, ear pain. No hearing loss. No allergies, seasonal or year-round. No epistaxis. No nasal discharge. No snoring. No postnasal drip. No sinus pain. No difficulty with swallowing. RESPIRATORY: No cough. No wheezing. No hemoptysis. No dyspnea. No chronic obstructive pulmonary disease. No tuberculosis. No pneumonia. CARDIOVASCULAR: No chest pain. No orthopnea. No edema. No arrhythmia. No dyspnea on exertion. No palpitations. No syncope. GASTROINTESTINAL: No nausea, vomiting, diarrhea. No abdominal pain. No hematemesis. No melena. No ulcers. No gastroesophageal reflux disease. No rectal bleeding. Does have history of hemorrhoids. GU: Denies any dysuria, hematuria, renal calculus, or frequency. ENDOCRINE: Denies any polyuria, nocturia, or thyroid problems. No increase in sweating, heat or cold intolerance or thirst. HEME/LYMPH: Denies any anemia, easy bruisability or bleeding. SKIN: No acne. No rash. No changes in mole or hair. MUSCULOSKELETAL: Denies any pain in the neck, back, or shoulder. NEUROLOGICAL: No numbness. No weakness. No dysarthria. No cerebrovascular accident. No transient ischemic attack. PSYCHIATRIC: Denies any anxiety. Complains of insomnia. No ADD. No OCD.   PHYSICAL EXAMINATION:  VITAL SIGNS: Temperature 96.7, pulse 77, respiratory rate 16, blood pressure 116/74, O2 93%.   GENERAL: The patient is a well developed, well nourished female in no acute distress.   HEENT: Head atraumatic, normocephalic. Pupils are equal, round, reactive to light and accommodation. Extraocular movements are intact. Oropharynx is clear without any exudates. There is no scleral icterus or conjunctival pallor. Nasal exam shows no ulceration or drainage. Ear  exam externally shows no drainage or  erythema.   NECK: There is no thyromegaly. No carotid bruits.   CARDIOVASCULAR: Regular rate and rhythm. No murmurs, rubs, clicks, or gallops. PMI is not displaced.   LUNGS: Clear to auscultation bilaterally without any rales, rhonchi, or wheezing.   ABDOMEN: Soft, nontender, nondistended. Positive bowel sounds x4.  EXTREMITIES: No clubbing, cyanosis, or edema.   NEUROLOGICAL: Awake, alert, oriented x3. No focal deficits.   SKIN: There is no rash.   LYMPHATICS: No lymph nodes palpable.   MUSCULOSKELETAL: There is no erythema or swelling.   PSYCHIATRIC: Not anxious or depressed.   LABORATORY, DIAGNOSTIC AND RADIOLOGICAL DATA: The patient had a potassium level of 7.0 at 15:08. Repeat potassium at 15:35 is 6.4. The patient is refusing any IV access at this point. She reports that she is a very difficult stick.   ASSESSMENT AND PLAN: The patient is a 49 year old American female on hemodialysis, was here at [facility] for declotting of an arteriovenous fistula, noted to have a potassium of 7.0, which was repeated later. Within one-half hour, her potassium was 6.4 The patient was seen by Nephrology. They recommended admission. The patient will have her fistula declotted in the a.m.   1. Hyperkalemia:  I will go ahead and repeat a dose of Kayexalate. I will monitor on telemetry. At this point her potassium has already decreased, so we do not need to give her any calcium gluconate, insulin, D50 etc. unless Nephrology feels that this is needed. They have already seen the patient prior to me. I will give her a dose of Kayexalate, monitor her potassium.  2. End-stage renal disease: We will resume hemodialysis when able.  3. Insomnia: We will continue Ambien as taking at home.  4. Prophylaxis: The patient is ambulatory. I will hold off on any heparin for the time being.   TIME SPENT: 35 minutes spent.  ____________________________ Lafonda Mosses. Posey Pronto, MD shp:cbb D: 07/03/2011 17:58:09  ET T: 07/03/2011 18:24:24 ET JOB#: [id number] cc: Liberato Stansbery H. Posey Pronto, MD, <Dictator> Loyce Dys, MD Alric Seton MD ELECTRONICALLY SIGNED 07/21/2011 8:15

## 2014-10-10 NOTE — Discharge Summary (Signed)
PATIENT NAME:  Angela Best, Angela Best MR#:  [id number] DATE OF BIRTH:  [birth year]  DATE OF ADMISSION:  07/03/2011 DATE OF DISCHARGE:  07/05/2011  PRIMARY CARE PHYSICIAN: Rudi Coco, MD  DISCHARGE DIAGNOSES:  1. Hyperkalemia.  2. Clotted AV fistula, status post declotting.  3. Insomnia.  4. Anemia of chronic kidney disease.  5. Secondary hyperparathyroidism.   DISCHARGE MEDICATIONS:  1. Calcium acetate 667 mg p.o. three capsules three times daily. 2. Ambien 10 mg as needed for sleep.  3. Tums 500 mg twice a day as needed for heartburn.  CONSULTANT: Vascular Surgery.  HOSPITAL COURSE: Hyperkalemia: The patient is a 49 year old female who came in to have AV fistula declotting as an outpatient. She had her potassium checked, which was 7, so we were asked to admit the patient for hyperkalemia. Her AV fistula was clotted in the left arm and she could not get dialysis. She had regular dialysis on Saturday. The patient was admitted and received Kayexalate along with insulin D50. The patient was seen by nephrology. The patient's repeat potassium was 5.3 after it was 6.4. The patient's dialysis AV fistula was declotted by Dr. Hortencia Pilar, on 07/04/2011. The patient has an allergy to contrast so she received prednisone. She had angioplasty of the venous cannulation site. She also had thrombectomy of the left arm brachial dialysis graft. She received dialysis on 07/05/2011. She did not have any symptoms. She was seen by nephrologist, Dr. Lavonia Dana, who said she can go home as her        AV fistula is working. She tolerated dialysis well and her hyperkalemia resolved. We discharged her home. The patient is supposed to follow up with Dr. Mervyn Skeeters for her regular dialysis.  TIME SPENT ON DISCHARGE PREPARATION: Less than 30 minutes.  ____________________________ Epifanio Lesches, MD sk:slb D: 07/10/2011 15:04:30 ET T: 07/11/2011 12:19:40 ET JOB#: [id number]  cc: Epifanio Lesches, MD,  <Dictator> Loyce Dys, MD Epifanio Lesches MD ELECTRONICALLY SIGNED 07/23/2011 13:35

## 2015-08-31 ENCOUNTER — Ambulatory Visit: Payer: Self-pay | Attending: Oncology

## 2015-10-03 ENCOUNTER — Encounter: Payer: Self-pay | Admitting: *Deleted

## 2015-10-03 ENCOUNTER — Ambulatory Visit: Payer: Medicare Other | Attending: Oncology | Admitting: *Deleted

## 2015-10-03 VITALS — BP 121/81 | HR 81 | Temp 98.2°F | Ht 63.39 in | Wt 186.0 lb

## 2015-10-03 DIAGNOSIS — N6459 Other signs and symptoms in breast: Secondary | ICD-10-CM

## 2015-10-03 NOTE — Progress Notes (Signed)
Subjective:     Patient ID: Angela Best, female   DOB: 12/18/1965, 50 y.o.   MRN: [id number]  HPI   Review of Systems     Objective:   Physical Exam  Pulmonary/Chest: Right breast exhibits no inverted nipple, no mass, no nipple discharge, no skin change and no tenderness. Left breast exhibits no inverted nipple, no mass, no nipple discharge, no skin change and no tenderness. Breasts are symmetrical.    Prominent blood vessels noted in the upper left breast.  Bilateral breast with fibroglandular tissue       Assessment:     50 year old Black female with history or renal disease, and has been on dialysis for the past 11 years per patient.  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  Patient was seen by Dr. 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  On clinical breast exam bilateral breast have scattered fibroglandular tissue. No edema noted on today's exam.  Called and talked with Jorene Minors in the breast center, and per protocol will still need to order bilateral diagnostic mammogram based on last birads 4 findings.  Taught self breast awareness.  Patient's last pap was in 2015.  States she needs a pap in order to be placed on the kidney transplant list.  She is going to fax me some info.  Patient with questions regarding menopausal symptoms associated with intercourse.  Encouraged use of vaginal moisturizers and lubricants.  Patient has been screened for eligibility.  She does not have any insurance, Medicare or Medicaid.  She also meets financial eligibility.  Hand-out given on the Affordable Care Act.      Plan:     Bilateral diagnostic mammogram and ultrasound scheduled for May 3.  She will also return for her pap at that time.  Will follow-up per [facility] protocol.

## 2015-10-03 NOTE — Patient Instructions (Signed)
Gave patient hand-out, Women Staying Healthy, Active and Well from [facility], with education on breast health, pap smears, heart and colon health. 

## 2015-10-19 ENCOUNTER — Ambulatory Visit
Admission: RE | Admit: 2015-10-19 | Discharge: 2015-10-19 | Disposition: A | Discharge status: Home / Self Care | Payer: Self-pay | Source: Ambulatory Visit | Attending: Oncology | Admitting: Oncology

## 2015-10-19 ENCOUNTER — Ambulatory Visit: Payer: Medicare Other | Attending: Oncology | Admitting: *Deleted

## 2015-10-19 VITALS — BP 109/74 | HR 68 | Temp 98.1°F | Resp 16

## 2015-10-19 DIAGNOSIS — N6459 Other signs and symptoms in breast: Secondary | ICD-10-CM

## 2015-10-19 DIAGNOSIS — Z0000 Encounter for general adult medical examination without abnormal findings: Secondary | ICD-10-CM

## 2015-10-19 NOTE — Patient Instructions (Signed)
Gave patient hand-out, Women Staying Healthy, Active and Well from [facility], with education on breast health, pap smears, heart and colon health. 

## 2015-10-19 NOTE — Progress Notes (Signed)
Subjective:     Patient ID: Angela Best, female   DOB: 12/26/1965, 50 y.o.   MRN: [id number]  HPI   Review of Systems     Objective:   Physical Exam  Genitourinary: No labial fusion. There is no rash, tenderness, lesion or injury on the right labia. There is no rash, tenderness, lesion or injury on the left labia. Cervix exhibits no motion tenderness, no discharge and no friability. Right adnexum displays no mass, no tenderness and no fullness. Left adnexum displays no mass, no tenderness and no fullness. No erythema, tenderness or bleeding in the vagina. No foreign body around the vagina. No signs of injury around the vagina. No vaginal discharge found.         Assessment:     50 year old Black female returns to clinic today for her pap smear.  I did talk with her transplant coordinator this week and her mammogram and pap are both required prior to being put on the transplant list.  Patient gave verbal consent to fax both results to the transplant coordinator.  Specimen collected for pap smear.  Patient refused rectal exam.       Plan:     Specimen sent to the lab.  Will follow up per [facility] protocol.

## 2015-10-22 LAB — PAP LB AND HPV HIGH-RISK
HPV, HIGH-RISK: POSITIVE — AB
PAP Smear Comment: 0

## 2015-10-27 ENCOUNTER — Encounter: Payer: Self-pay | Admitting: *Deleted

## 2015-10-27 NOTE — Progress Notes (Signed)
Talked to patient today and reviewed her normal mammogram results and HPV positive pap results.  Per patients request will mail her a copy of both reports for her to take to her transplant case worker.  Explained need to return in one year for annual mammogram and pap smear.  Letter mailed with next appointment.  [facility]IS to [place].

## 2016-10-17 ENCOUNTER — Ambulatory Visit: Payer: Medicare Other

## 2016-10-29 ENCOUNTER — Ambulatory Visit: Payer: Medicare Other

## 2016-11-07 ENCOUNTER — Ambulatory Visit: Payer: Medicare Other

## 2016-11-28 ENCOUNTER — Ambulatory Visit: Payer: Medicare Other | Attending: Oncology

## 2017-07-15 ENCOUNTER — Other Ambulatory Visit
Admission: RE | Admit: 2017-07-15 | Discharge: 2017-07-15 | Disposition: A | Discharge status: Home / Self Care | Payer: Medicare Other | Source: Ambulatory Visit | Attending: Internal Medicine | Admitting: Internal Medicine

## 2017-07-15 LAB — CALCIUM: Calcium: 6.4 mg/dL — CL (ref 8.9–10.3)

## 2018-03-07 ENCOUNTER — Other Ambulatory Visit: Payer: Self-pay | Admitting: Family Medicine

## 2018-03-07 DIAGNOSIS — Z1231 Encounter for screening mammogram for malignant neoplasm of breast: Secondary | ICD-10-CM

## 2018-04-14 ENCOUNTER — Ambulatory Visit
Admission: RE | Admit: 2018-04-14 | Discharge: 2018-04-14 | Disposition: A | Discharge status: Home / Self Care | Payer: Medicare Other | Source: Ambulatory Visit | Attending: Family Medicine | Admitting: Family Medicine

## 2018-04-14 DIAGNOSIS — Z1231 Encounter for screening mammogram for malignant neoplasm of breast: Secondary | ICD-10-CM | POA: Diagnosis not present

## 2018-05-27 ENCOUNTER — Encounter: Payer: Medicare Other | Admitting: Podiatry

## 2018-06-02 NOTE — Progress Notes (Signed)
This encounter was created in error - please disregard.

## 2020-04-13 ENCOUNTER — Telehealth: Payer: Self-pay | Admitting: Licensed Clinical Social Worker

## 2020-04-13 NOTE — Telephone Encounter (Signed)
LCSW received vm from Aliene Beams, RN at [facility] she reported patient was in need of mental health services. LCSW followed up with patient and provided resource information.

## 2021-03-08 ENCOUNTER — Emergency Department: Payer: Medicare Other

## 2021-03-08 ENCOUNTER — Encounter: Payer: Self-pay | Admitting: Emergency Medicine

## 2021-03-08 ENCOUNTER — Emergency Department
Admission: EM | Admit: 2021-03-08 | Discharge: 2021-03-08 | Disposition: A | Discharge status: Home / Self Care | Payer: Medicare Other | Attending: Emergency Medicine | Admitting: Emergency Medicine

## 2021-03-08 ENCOUNTER — Other Ambulatory Visit: Payer: Self-pay

## 2021-03-08 DIAGNOSIS — Z20822 Contact with and (suspected) exposure to covid-19: Secondary | ICD-10-CM | POA: Diagnosis not present

## 2021-03-08 DIAGNOSIS — Z86711 Personal history of pulmonary embolism: Secondary | ICD-10-CM | POA: Diagnosis not present

## 2021-03-08 DIAGNOSIS — N186 End stage renal disease: Secondary | ICD-10-CM | POA: Insufficient documentation

## 2021-03-08 DIAGNOSIS — R531 Weakness: Secondary | ICD-10-CM

## 2021-03-08 DIAGNOSIS — Z7901 Long term (current) use of anticoagulants: Secondary | ICD-10-CM | POA: Diagnosis not present

## 2021-03-08 DIAGNOSIS — M25512 Pain in left shoulder: Secondary | ICD-10-CM | POA: Insufficient documentation

## 2021-03-08 DIAGNOSIS — H66002 Acute suppurative otitis media without spontaneous rupture of ear drum, left ear: Secondary | ICD-10-CM | POA: Diagnosis not present

## 2021-03-08 DIAGNOSIS — Z992 Dependence on renal dialysis: Secondary | ICD-10-CM | POA: Diagnosis not present

## 2021-03-08 DIAGNOSIS — R519 Headache, unspecified: Secondary | ICD-10-CM | POA: Diagnosis not present

## 2021-03-08 DIAGNOSIS — Z7982 Long term (current) use of aspirin: Secondary | ICD-10-CM | POA: Diagnosis not present

## 2021-03-08 DIAGNOSIS — I120 Hypertensive chronic kidney disease with stage 5 chronic kidney disease or end stage renal disease: Secondary | ICD-10-CM | POA: Diagnosis not present

## 2021-03-08 HISTORY — DX: Pure hypercholesterolemia, unspecified: E78.00

## 2021-03-08 HISTORY — DX: Essential (primary) hypertension: I10

## 2021-03-08 LAB — BASIC METABOLIC PANEL
Anion gap: 16 — ABNORMAL HIGH (ref 5–15)
BUN: 26 mg/dL — ABNORMAL HIGH (ref 6–20)
CO2: 25 mmol/L (ref 22–32)
Calcium: 9.4 mg/dL (ref 8.9–10.3)
Chloride: 95 mmol/L — ABNORMAL LOW (ref 98–111)
Creatinine, Ser: 8.9 mg/dL — ABNORMAL HIGH (ref 0.44–1.00)
GFR, Estimated: 5 mL/min — ABNORMAL LOW (ref >60)
Glucose, Bld: 81 mg/dL (ref 70–99)
Potassium: 3.9 mmol/L (ref 3.5–5.1)
Sodium: 136 mmol/L (ref 135–145)

## 2021-03-08 LAB — CBC
HCT: 38.4 % (ref 36.0–46.0)
Hemoglobin: 11.8 g/dL — ABNORMAL LOW (ref 12.0–15.0)
MCH: 30.6 pg (ref 26.0–34.0)
MCHC: 30.7 g/dL (ref 30.0–36.0)
MCV: 99.5 fL (ref 80.0–100.0)
Platelets: 273 10*3/uL (ref 150–400)
RBC: 3.86 MIL/uL — ABNORMAL LOW (ref 3.87–5.11)
RDW: 14.6 % (ref 11.5–15.5)
WBC: 9 10*3/uL (ref 4.0–10.5)
nRBC: 0 % (ref 0.0–0.2)

## 2021-03-08 LAB — RESP PANEL BY RT-PCR (FLU A&B, COVID) ARPGX2
Influenza A by PCR: NEGATIVE
Influenza B by PCR: NEGATIVE
SARS Coronavirus 2 by RT PCR: NEGATIVE

## 2021-03-08 LAB — TROPONIN I (HIGH SENSITIVITY)
Troponin I (High Sensitivity): 14 ng/L (ref <18)
Troponin I (High Sensitivity): 15 ng/L (ref <18)

## 2021-03-08 MED ORDER — ONDANSETRON 4 MG PO TBDP
4.0000 mg | ORAL_TABLET | Freq: Once | ORAL | Status: AC
  Administered 2021-03-08: 4 mg via ORAL
  Filled 2021-03-08: qty 1

## 2021-03-08 MED ORDER — AMOXICILLIN 500 MG PO CAPS: 500.0000 mg | ORAL_CAPSULE | Freq: Every day | ORAL | 0 refills | Status: AC

## 2021-03-08 MED ORDER — LIDOCAINE 5 % EX PTCH: 1.0000 | MEDICATED_PATCH | Freq: Two times a day (BID) | CUTANEOUS | 0 refills | Status: AC

## 2021-03-08 MED ORDER — LIDOCAINE 5 % EX PTCH
1.0000 | MEDICATED_PATCH | Freq: Once | CUTANEOUS | Status: DC
  Administered 2021-03-08: 1 via TRANSDERMAL
  Filled 2021-03-08: qty 1

## 2021-03-08 MED ORDER — BUTALBITAL-APAP-CAFFEINE 50-325-40 MG PO TABS
2.0000 | ORAL_TABLET | Freq: Once | ORAL | Status: AC
  Administered 2021-03-08: 2 via ORAL
  Filled 2021-03-08: qty 2

## 2021-03-08 MED ORDER — BUTALBITAL-APAP-CAFFEINE 50-325-40 MG PO TABS: 1.0000 | ORAL_TABLET | Freq: Four times a day (QID) | ORAL | 0 refills | Status: AC | PRN

## 2021-03-08 MED ORDER — AMOXICILLIN 500 MG PO CAPS
500.0000 mg | ORAL_CAPSULE | Freq: Once | ORAL | Status: AC
  Administered 2021-03-08: 500 mg via ORAL
  Filled 2021-03-08: qty 1

## 2021-03-08 NOTE — Discharge Instructions (Signed)
Use Tylenol for pain and fevers.  Up to 1000 mg per dose, up to 4 times per day.  Do not take more than 4000 mg of Tylenol/acetaminophen within 24 hours.  Do not take Tylenol while taking the Fioricet headache medication, as it already includes Tylenol.  Choose one or the other.  Please use lidocaine patches and your site of pain.  Apply 1 patch at a time, leave on for 12 hours, then remove for 12 hours.  12 hours on, 12 hours off.  Do not apply more than 1 patch at a time.  You are being discharged with a few days of amoxicillin antibiotic.  Take this medication once daily for total of 5 days, we gave the dose for today already, so take your dose tomorrow in the afternoon/evening.

## 2021-03-08 NOTE — ED Provider Notes (Signed)
[facility] Emergency Department Provider Note ____________________________________________   Event Date/Time   First MD Initiated Contact with Patient 03/08/21 1742     (approximate)  I have reviewed the triage vital signs and the nursing notes.  HISTORY  Chief Complaint No chief complaint on file.   HPI Angela Best is a 55 y.o. femalewho presents to the ED for evaluation of weakness.   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  Reduced ejection fraction.  Patient presents to the ED for evaluation of weakness, headache and feeling poorly in a generalized fashion.  She reports that she often gets headaches on dialysis days, treated with Tylenol, but today she felt sick and threw up her Tylenol and her headache is gotten worse progressively throughout the day today.  Reports left-sided headache, left ear pain, left shoulder pain rating upwards towards her occiput and over her head.  Denies any falls or trauma, vision changes, fever, abdominal pain, diarrhea or other emesis.  On the reports postprandial emesis, poor appetite and emesis with her medications, such that she has not taken any of her medications yet today.  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 Authorizing Provider  amoxicillin (AMOXIL) 500 MG capsule Take 1 capsule (500 mg total) by mouth daily for 4 days. 03/08/21  03/12/21 Yes Vladimir Crofts, MD  aspirin 81 MG tablet Take 81 mg by mouth daily.    [provider]  butalbital-acetaminophen-caffeine (FIORICET) [phone] MG tablet Take 1-2 tablets by mouth every 6 (six) hours as needed for headache. 03/08/21 03/08/22 Yes Vladimir Crofts, MD  calcium & magnesium carbonates (MYLANTA) [id number] MG per tablet Take 1 tablet by mouth daily.    [provider]  lidocaine (LIDODERM) 5 % Place 1 patch onto the skin every 12 (twelve) hours. Remove & Discard patch within 12 hours or as directed by MD 03/08/21 03/08/22 Yes Vladimir Crofts, MD  warfarin (COUMADIN) 1 MG tablet Take 1 tablet by mouth 4 (four) times daily. 12/14/12   [provider]    Allergies Benadryl [diphenhydramine hcl], Captopril, Contrast media [iodinated diagnostic agents], Demerol [meperidine], Morphine and related, and Tape  Family History  Problem Relation Age of Onset   Breast cancer Mother     Social History Social History   Tobacco Use   Smoking status: Never   Smokeless tobacco: Never  Substance Use Topics   Alcohol use: No   Drug use: No    Review of Systems  Constitutional: No fever/chills Eyes: No visual changes. ENT: No sore throat. Left ear pain Cardiovascular: Denies chest pain. Respiratory: Denies shortness of breath. Gastrointestinal: No abdominal pain.  No nausea, no vomiting.  No diarrhea.  No constipation. Genitourinary: Negative for dysuria. Musculoskeletal: Negative for back pain. Skin: Negative for rash. Neurological: Negative for  focal weakness or  numbness. Headache ____________________________________________   PHYSICAL EXAM:  VITAL SIGNS: Vitals:   03/08/21 1354 03/08/21 1644  BP: (!) 190/93 (!) 184/96  Pulse: 91 94  Resp: 16 15  Temp: 98.5 F (36.9 C) 98 F (36.7 C)  SpO2: 99% 99%     Constitutional: Alert and oriented. Well appearing and in no acute distress. Eyes: Conjunctivae are normal. PERRL. EOMI. Head:  Atraumatic. Right TM is clouded by cerumen burden Left TM is cloudy, white and bulging No pain with ear manipulation bilaterally, no mastoid tenderness bilaterally or overlying skin changes externally. Nose: No congestion/rhinnorhea. Mouth/Throat: Mucous membranes are moist.  Oropharynx non-erythematous. Neck: No stridor. No cervical spine tenderness to palpation. Cardiovascular: Normal rate, regular rhythm. Grossly normal heart sounds.  Good peripheral circulation. Respiratory: Normal respiratory effort.  No retractions. Lungs CTAB. Gastrointestinal: Soft , nondistended, nontender to palpation. No CVA tenderness. Musculoskeletal: No lower extremity tenderness nor edema.  No joint effusions. No signs of acute trauma. Tenderness to palpation to left greater than right trapezius musculature, reproducing her symptoms.  Tenderness to left-sided paraspinal cervical musculature and base of the occiput. Neurologic:  Normal speech and language. No gross focal neurologic deficits are appreciated. No gait instability noted. Cranial nerves II through XII intact 5/5 strength and sensation in all 4 extremities Skin:  Skin is warm, dry and intact. No rash noted. Psychiatric: Mood and affect are normal. Speech and behavior are normal.  ____________________________________________   LABS (all labs ordered are listed, but only abnormal results are displayed)  Labs Reviewed  BASIC METABOLIC PANEL - Abnormal; Notable for the following components:      Result Value   Chloride 95 (*)    BUN 26 (*)    Creatinine, Ser 8.90 (*)    GFR, Estimated 5 (*)    Anion gap 16 (*)    All other components within normal limits  CBC - Abnormal; Notable for the following components:   RBC 3.86 (*)    Hemoglobin 11.8 (*)    All other components within normal limits  RESP PANEL BY RT-PCR (FLU A&B, COVID) ARPGX2  POC URINE PREG, ED  TROPONIN I (HIGH SENSITIVITY)  TROPONIN I (HIGH SENSITIVITY)    ____________________________________________  12 Lead EKG  Sinus rhythm with a rate of 95 bpm.  Normal axis.  Slightly prolonged QTC at 516 ms, no evidence of acute ischemia. ____________________________________________  RADIOLOGY  ED MD interpretation: 2 view CXR reviewed by me without evidence of acute cardiopulmonary pathology.  Official radiology report(s): DG Chest 2 View  Result Date: 03/08/2021 CLINICAL DATA:  Chest pain EXAM: CHEST - 2 VIEW COMPARISON:  Chest x-ray dated January 11, 2013 FINDINGS: Cardiac and mediastinal contours are unchanged. Bibasilar atelectasis. No focal consolidation. Inferior approach hero graft. Vascular stent overlying the left axilla. No pleural effusion or pneumothorax. IMPRESSION: No active cardiopulmonary disease. Electronically Signed   By: Yetta Glassman M.D.   On: 03/08/2021 14:55    ____________________________________________   PROCEDURES and INTERVENTIONS  Procedure(s) performed (including Critical Care):  Procedures  Medications  lidocaine (LIDODERM) 5 % 1 patch (1 patch Transdermal Patch Applied 03/08/21 1808)  ondansetron (ZOFRAN-ODT) disintegrating tablet 4 mg (4 mg Oral Given 03/08/21 1808)  butalbital-acetaminophen-caffeine (FIORICET) 50-325-40 MG per tablet 2 tablet (2 tablets Oral Given 03/08/21 1808)  amoxicillin (AMOXIL) capsule 500 mg (500 mg Oral Given 03/08/21 1809)    ____________________________________________   MDM / ED COURSE   55 year old woman on dialysis presents to the ED with evidence of trapezius spasm  and occipital tension headache, with further evidence of acute otitis media, ultimately amenable to outpatient management.  Hypertensive, but has not taken her medications yet today.  Appears clinically well, but is quite tender to her left trapezius, going up her left-sided paraspinal musculature, reproducing her headache.  I do note evidence of acute otitis media without evidence of mastoiditis, malignant otitis  externa or other complications.  This could be the etiology of her more deep left ear pain, possibly precipitating her muscular pain.  Blood work is unremarkable.  No evidence of COVID or flu.  We will discharge with short course amoxicillin for AOM and return precautions.  Clinical Course as of 03/08/21 1947  Wed Mar 08, 2021  1943 Reassessed.  Patient reports feeling a lot better.  Requesting these medications to go home with.  Requesting a work note for her husband. [DS]    Clinical Course User Index [DS] Vladimir Crofts, MD    ____________________________________________   FINAL CLINICAL IMPRESSION(S) / ED DIAGNOSES  Final diagnoses:  ESRD (end stage renal disease) on dialysis [facility])  Generalized weakness  Non-recurrent acute suppurative otitis media of left ear without spontaneous rupture of tympanic membrane     ED Discharge Orders          Ordered    amoxicillin (AMOXIL) 500 MG capsule  Daily        03/08/21 1945    butalbital-acetaminophen-caffeine (FIORICET) 50-325-40 MG tablet  Every 6 hours PRN        03/08/21 1945    lidocaine (LIDODERM) 5 %  Every 12 hours        03/08/21 1945             Lynden Carrithers Tamala Julian   Note:  This document was prepared using Dragon voice recognition software and may include unintentional dictation errors.    Vladimir Crofts, MD 03/08/21 [phone]

## 2021-03-08 NOTE — ED Triage Notes (Signed)
C/O headache today at dialysis.  Since then patient feeling weak, SOB, chest pain.  AAOx3.  Skin warm and dry. NAD

## 2021-03-08 NOTE — ED Triage Notes (Signed)
FIRST NURSE NOTE: Pt comes into the ED via EMS from home with HA, chills, nausea, only did 2hrs of dialysis this morning due to not feeling well, upon arrival the pt reports chest pain   98HR 159/81 100%RA RR24

## 2021-03-08 NOTE — ED Provider Notes (Signed)
Emergency Medicine Provider Triage Evaluation Note  Angela Best , a 55 y.o. female  was evaluated in triage.  Pt complains of chest pain and headache with chills, nausea and vomiting. Unable to complete dialysis this morning.  Review of Systems  Positive: Nausea, vomiting, headache, chest pain Negative: Shortness of breath  Physical Exam  LMP 10/02/2005 (Approximate)  Gen:   Awake, no distress   Resp:  Normal effort  MSK:   Moves extremities without difficulty  Other:    Medical Decision Making  Medically screening exam initiated at 1:50 PM.  Appropriate orders placed.  Angela Best was informed that the remainder of the evaluation will be completed by another provider, this initial triage assessment does not replace that evaluation, and the importance of remaining in the ED until their evaluation is complete.    Angela Dike, FNP 03/08/21 1351    Angela Starch, MD 03/08/21 1409

## 2022-01-18 ENCOUNTER — Other Ambulatory Visit: Payer: Self-pay | Admitting: Family Medicine

## 2022-01-18 DIAGNOSIS — Z1231 Encounter for screening mammogram for malignant neoplasm of breast: Secondary | ICD-10-CM

## 2023-04-25 ENCOUNTER — Other Ambulatory Visit: Payer: Self-pay | Admitting: Family Medicine

## 2023-04-25 DIAGNOSIS — Z1231 Encounter for screening mammogram for malignant neoplasm of breast: Secondary | ICD-10-CM
